# Patient Record
Sex: Male | Born: 1975 | Race: White | Hispanic: No | Marital: Single | Smoking: Current every day smoker
Health system: Southern US, Community
[De-identification: ages and names within clinical notes are randomized; demographics above are authoritative.]

## PROBLEM LIST (undated history)

## (undated) DIAGNOSIS — K909 Intestinal malabsorption, unspecified: Secondary | ICD-10-CM

## (undated) DIAGNOSIS — M419 Scoliosis, unspecified: Secondary | ICD-10-CM

## (undated) DIAGNOSIS — H9190 Unspecified hearing loss, unspecified ear: Secondary | ICD-10-CM

## (undated) DIAGNOSIS — I471 Supraventricular tachycardia, unspecified: Secondary | ICD-10-CM

## (undated) DIAGNOSIS — B069 Rubella without complication: Secondary | ICD-10-CM

## (undated) HISTORY — PX: GASTROSTOMY W/ FEEDING TUBE: SUR642

## (undated) HISTORY — PX: TRACHEOSTOMY: SUR1362

## (undated) HISTORY — PX: BACK SURGERY: SHX140

---

## 2012-08-09 ENCOUNTER — Encounter (HOSPITAL_COMMUNITY): Payer: Self-pay | Admitting: *Deleted

## 2012-08-09 ENCOUNTER — Inpatient Hospital Stay (HOSPITAL_COMMUNITY)
Admission: EM | Admit: 2012-08-09 | Discharge: 2012-08-11 | DRG: 193 | Disposition: A | Discharge status: Home / Self Care | Payer: No Typology Code available for payment source | Attending: Internal Medicine | Admitting: Internal Medicine

## 2012-08-09 ENCOUNTER — Emergency Department (HOSPITAL_COMMUNITY): Payer: No Typology Code available for payment source

## 2012-08-09 DIAGNOSIS — E46 Unspecified protein-calorie malnutrition: Secondary | ICD-10-CM

## 2012-08-09 DIAGNOSIS — D72829 Elevated white blood cell count, unspecified: Secondary | ICD-10-CM | POA: Diagnosis present

## 2012-08-09 DIAGNOSIS — H9190 Unspecified hearing loss, unspecified ear: Secondary | ICD-10-CM | POA: Diagnosis present

## 2012-08-09 DIAGNOSIS — E43 Unspecified severe protein-calorie malnutrition: Secondary | ICD-10-CM

## 2012-08-09 DIAGNOSIS — F17200 Nicotine dependence, unspecified, uncomplicated: Secondary | ICD-10-CM

## 2012-08-09 DIAGNOSIS — M4120 Other idiopathic scoliosis, site unspecified: Secondary | ICD-10-CM | POA: Diagnosis present

## 2012-08-09 DIAGNOSIS — M419 Scoliosis, unspecified: Secondary | ICD-10-CM

## 2012-08-09 DIAGNOSIS — I471 Supraventricular tachycardia, unspecified: Secondary | ICD-10-CM

## 2012-08-09 DIAGNOSIS — I498 Other specified cardiac arrhythmias: Secondary | ICD-10-CM | POA: Diagnosis present

## 2012-08-09 DIAGNOSIS — J9600 Acute respiratory failure, unspecified whether with hypoxia or hypercapnia: Secondary | ICD-10-CM

## 2012-08-09 DIAGNOSIS — Z681 Body mass index (BMI) 19 or less, adult: Secondary | ICD-10-CM

## 2012-08-09 DIAGNOSIS — J189 Pneumonia, unspecified organism: Principal | ICD-10-CM

## 2012-08-09 DIAGNOSIS — K909 Intestinal malabsorption, unspecified: Secondary | ICD-10-CM | POA: Diagnosis present

## 2012-08-09 DIAGNOSIS — F1210 Cannabis abuse, uncomplicated: Secondary | ICD-10-CM | POA: Diagnosis present

## 2012-08-09 HISTORY — DX: Supraventricular tachycardia: I47.1

## 2012-08-09 HISTORY — DX: Scoliosis, unspecified: M41.9

## 2012-08-09 HISTORY — DX: Supraventricular tachycardia, unspecified: I47.10

## 2012-08-09 HISTORY — DX: Intestinal malabsorption, unspecified: K90.9

## 2012-08-09 HISTORY — DX: Rubella without complication: B06.9

## 2012-08-09 LAB — COMPREHENSIVE METABOLIC PANEL
AST: 22 U/L (ref 0–37)
Albumin: 4.4 g/dL (ref 3.5–5.2)
Alkaline Phosphatase: 116 U/L (ref 39–117)
BUN: 5 mg/dL — ABNORMAL LOW (ref 6–23)
Creatinine, Ser: 0.55 mg/dL (ref 0.50–1.35)
Potassium: 3.8 mEq/L (ref 3.5–5.1)
Total Protein: 8.6 g/dL — ABNORMAL HIGH (ref 6.0–8.3)

## 2012-08-09 LAB — HIV ANTIBODY (ROUTINE TESTING W REFLEX): HIV: NONREACTIVE

## 2012-08-09 LAB — CBC WITH DIFFERENTIAL/PLATELET
Basophils Absolute: 0 10*3/uL (ref 0.0–0.1)
Basophils Relative: 0 % (ref 0–1)
Eosinophils Absolute: 0.1 10*3/uL (ref 0.0–0.7)
Hemoglobin: 15.9 g/dL (ref 13.0–17.0)
MCH: 33.3 pg (ref 26.0–34.0)
MCHC: 34.8 g/dL (ref 30.0–36.0)
Monocytes Relative: 7 % (ref 3–12)
Neutrophils Relative %: 86 % — ABNORMAL HIGH (ref 43–77)
RDW: 13 % (ref 11.5–15.5)

## 2012-08-09 LAB — RAPID URINE DRUG SCREEN, HOSP PERFORMED
Amphetamines: NOT DETECTED
Benzodiazepines: NOT DETECTED
Opiates: NOT DETECTED

## 2012-08-09 LAB — URINALYSIS, ROUTINE W REFLEX MICROSCOPIC
Glucose, UA: NEGATIVE mg/dL
Hgb urine dipstick: NEGATIVE
Ketones, ur: NEGATIVE mg/dL
Protein, ur: NEGATIVE mg/dL

## 2012-08-09 LAB — ETHANOL: Alcohol, Ethyl (B): <11 mg/dL (ref 0–11)

## 2012-08-09 LAB — STREP PNEUMONIAE URINARY ANTIGEN: Strep Pneumo Urinary Antigen: NEGATIVE

## 2012-08-09 MED ORDER — SODIUM CHLORIDE 0.9 % IJ SOLN: 3.0000 mL | INTRAMUSCULAR | Status: DC | PRN

## 2012-08-09 MED ORDER — SODIUM CHLORIDE 0.9 % IV SOLN
INTRAVENOUS | Status: DC
  Administered 2012-08-09: 11:00:00 via INTRAVENOUS

## 2012-08-09 MED ORDER — SODIUM CHLORIDE 0.9 % IV SOLN: 250.0000 mL | INTRAVENOUS | Status: DC | PRN

## 2012-08-09 MED ORDER — DEXTROSE 5 % IV SOLN
500.0000 mg | INTRAVENOUS | Status: DC
  Administered 2012-08-09: 500 mg via INTRAVENOUS
  Filled 2012-08-09: qty 500

## 2012-08-09 MED ORDER — NICOTINE 14 MG/24HR TD PT24
14.0000 mg | MEDICATED_PATCH | Freq: Every day | TRANSDERMAL | Status: DC
  Administered 2012-08-09 – 2012-08-10 (×2): 14 mg via TRANSDERMAL
  Filled 2012-08-09 (×3): qty 1

## 2012-08-09 MED ORDER — SODIUM CHLORIDE 0.9 % IJ SOLN: 3.0000 mL | Freq: Two times a day (BID) | INTRAMUSCULAR | Status: DC

## 2012-08-09 MED ORDER — DEXTROSE 5 % IV SOLN
1.0000 g | INTRAVENOUS | Status: DC
  Administered 2012-08-10: 1 g via INTRAVENOUS
  Filled 2012-08-09 (×2): qty 10

## 2012-08-09 MED ORDER — DEXTROSE 5 % IV SOLN
1.0000 g | INTRAVENOUS | Status: DC
  Administered 2012-08-09: 1 g via INTRAVENOUS
  Filled 2012-08-09: qty 10

## 2012-08-09 MED ORDER — AZITHROMYCIN 500 MG PO TABS
500.0000 mg | ORAL_TABLET | ORAL | Status: DC
  Administered 2012-08-10: 500 mg via ORAL
  Filled 2012-08-09 (×2): qty 1

## 2012-08-09 MED ORDER — IOHEXOL 350 MG/ML SOLN
100.0000 mL | Freq: Once | INTRAVENOUS | Status: AC | PRN
  Administered 2012-08-09: 100 mL via INTRAVENOUS

## 2012-08-09 MED ORDER — SODIUM CHLORIDE 0.9 % IV BOLUS (SEPSIS)
1000.0000 mL | Freq: Once | INTRAVENOUS | Status: AC
  Administered 2012-08-09: 1000 mL via INTRAVENOUS

## 2012-08-09 MED ORDER — ENSURE PUDDING PO PUDG
1.0000 | Freq: Three times a day (TID) | ORAL | Status: DC
  Filled 2012-08-09 (×8): qty 1

## 2012-08-09 MED ORDER — ZOLPIDEM TARTRATE 5 MG PO TABS
5.0000 mg | ORAL_TABLET | Freq: Once | ORAL | Status: AC
  Administered 2012-08-09: 5 mg via ORAL
  Filled 2012-08-09: qty 1

## 2012-08-09 NOTE — Progress Notes (Signed)
Patient utilizing his ipad to communicate with staff. His girlfriend is at the bedside however she is deaf and mute as well.

## 2012-08-09 NOTE — ED Notes (Signed)
[facility]e called for report, RN cannot take report at this time

## 2012-08-09 NOTE — Progress Notes (Signed)
RN from [facility]east returned call to ED nurse. Not able to speak to nurse.

## 2012-08-09 NOTE — ED Provider Notes (Signed)
History     CSN: [id number]  Arrival date & time 08/09/12  4098   First MD Initiated Contact with Patient 08/09/12 0900      Chief Complaint  Patient presents with  . Shortness of Breath  . Tachycardia    (Consider location/radiation/quality/duration/timing/severity/associated sxs/prior treatment) Patient is a 37 y.o. male presenting with shortness of breath. The history is provided by a parent and the patient.  Shortness of Breath  patient here after complaining of heart racing and shortness of breath. Symptoms have been there for 2-3 days. Has had a cough has been nonproductive. Denies any black or bloody stools. No chest pain chest pressure. Does have a history of SVT. No vomiting or diarrhea. Symptoms have been gradually getting worse and no treatment used prior to arrival. Patient does have a history of illicit drug use the past and admits to heavy alcohol use daily  Past Medical History  Diagnosis Date  . SVT (supraventricular tachycardia)   . Malabsorption   . [place] measles     No past surgical history on file.  No family history on file.  History  Substance Use Topics  . Smoking status: Not on file  . Smokeless tobacco: Not on file  . Alcohol Use: Not on file      Review of Systems  Respiratory: Positive for shortness of breath.   All other systems reviewed and are negative.    Allergies  Atropine  Home Medications  No current outpatient prescriptions on file.  BP 139/88  Pulse 99  Temp(Src) 98.1 F (36.7 C) (Oral)  Resp 20  SpO2 88%  Physical Exam  Nursing note and vitals reviewed. Constitutional: He is oriented to person, place, and time. He appears cachectic.  Non-toxic appearance. He has a sickly appearance. He appears ill. No distress.  HENT:  Head: Normocephalic and atraumatic.  Eyes: Conjunctivae, EOM and lids are normal. Pupils are equal, round, and reactive to light.  Neck: Normal range of motion. Neck supple. No tracheal deviation  present. No mass present.  Cardiovascular: Normal rate, regular rhythm and normal heart sounds.  Exam reveals no gallop.   No murmur heard. Pulmonary/Chest: Effort normal. No stridor. No respiratory distress. He has decreased breath sounds. He has wheezes. He has no rhonchi. He has no rales.  Abdominal: Soft. Normal appearance and bowel sounds are normal. He exhibits no distension. There is no tenderness. There is no rebound and no CVA tenderness.  Musculoskeletal: Normal range of motion. He exhibits no edema and no tenderness.  Neurological: He is alert and oriented to person, place, and time. He has normal strength. No cranial nerve deficit or sensory deficit. GCS eye subscore is 4. GCS verbal subscore is 5. GCS motor subscore is 6.  Skin: Skin is warm and dry. No abrasion and no rash noted.  Psychiatric: He has a normal mood and affect. His speech is normal and behavior is normal.    ED Course  Procedures (including critical care time)  Labs Reviewed - No data to display No results found.   No diagnosis found.    MDM   Date: 08/09/2012  Rate: 110  Rhythm: sinus tachycardia  QRS Axis: normal  Intervals: normal  ST/T Wave abnormalities: nonspecific ST changes  Conduction Disutrbances:none  Narrative Interpretation:   Old EKG Reviewed: none available  1:05 PM Pt to be admitted for CAP        Toy Baker, MD 08/09/12 1305

## 2012-08-09 NOTE — ED Notes (Signed)
Per pt mother: Onset of SOB and "heart racing" last night, pt called ems around 230 am, refused to come to ED at that time. Pt texted mother this morning with same symptoms, brought into ED. Pt states he was not doing anything physical when the SOB - recently moved into new apartment (4-6 weeks ago), states air is "different" there. No hx of SOB

## 2012-08-09 NOTE — H&P (Signed)
[facility] History and Physical  KWABENA STRUTZ ZOX:[id number] DOB: 1975/07/13 DOA: 08/09/2012  Referring physician: Freida Busman PCP: No primary provider on file.  Specialists: none  Chief Complaint: SOB  HPI: Richard York is a 37 y.o. male  With history of malabsorption, SVT, and scoliosis.  Presented to the Ed complaining of SOB which started yesterday evening.  Patient is deaf and history is obtained through interpretation from mother.  He reports that the problem has been persistent since onset and nothing he is aware of has made it better or worse.  Currently feels improvement while on supplemental oxygen.  While in the Ed patient has elevated WBC of 14.9, normal lactic acid level, CT antiogram of chest showing no evidence of PE but mild patchy opacities in the Right upper and Right middle lobe suspicious for pneumonia.    We have been consulted for admission evaluation and recommendations for hypoxia and pna  Review of Systems: 10 point review of system reviewed and negative unless otherwise mentioned above.  Past Medical History  Diagnosis Date  . SVT (supraventricular tachycardia)   . Malabsorption   . [place] measles   . Scoliosis    Past Surgical History  Procedure Laterality Date  . Back surgery      titanium rods for scoliosis  . Tracheostomy    . Gastrostomy w/ feeding tube     Social History:  reports that he has been smoking Cigarettes.  He has been smoking about 1.00 pack per day. He does not have any smokeless tobacco history on file. He reports that  drinks alcohol. He reports that he uses illicit drugs (Marijuana). Lives at his home  Can patient participate in ADLs? yes  Allergies  Allergen Reactions  . Atropine Anaphylaxis and Swelling  . Albuterol     No family history on file. None other reported  Prior to Admission medications   Not on File   Physical Exam: Filed Vitals:   08/09/12 0931 08/09/12 1107 08/09/12 1135 08/09/12 1310  BP:   151/82 148/77 133/78  Pulse:  99 114 91  Temp:  97.8 F (36.6 C) 98.1 F (36.7 C)   TempSrc:  Oral Oral   Resp:  26 24 23   Height:  5\' 8"  (1.727 m)    Weight:  38.556 kg (85 lb)    SpO2: 86% 93% 94% 97%     General:  Pt in NAd, Alert and Awake, thin appearing male  Eyes: EOMI, non icteric  ENT: normal exterior appearance  Neck: supple, no goiter  Cardiovascular: RRR, no MRG  Respiratory: no wheezes, breathing comfortably on supplemental oxygen via [place], mild rhales  Abdomen: soft, NT, ND   Skin: warm and dry  Musculoskeletal: no cyanosis or clubbing  Psychiatric: mood and affect appropriate  Neurologic: answers questions appropriately, follows directions, moves all extremities.  Labs on Admission:  Basic Metabolic Panel:  Recent Labs Lab 08/09/12 0945  NA 132*  K 3.8  CL 93*  CO2 30  GLUCOSE 110*  BUN 5*  CREATININE 0.55  CALCIUM 9.9   Liver Function Tests:  Recent Labs Lab 08/09/12 0945  AST 22  ALT 15  ALKPHOS 116  BILITOT 0.5  PROT 8.6*  ALBUMIN 4.4   No results found for this basename: LIPASE, AMYLASE,  in the last 168 hours No results found for this basename: AMMONIA,  in the last 168 hours CBC:  Recent Labs Lab 08/09/12 0945  WBC 14.9*  NEUTROABS 12.8*  HGB 15.9  HCT 45.7  MCV 95.8  PLT 265   Cardiac Enzymes: No results found for this basename: CKTOTAL, CKMB, CKMBINDEX, TROPONINI,  in the last 168 hours  BNP (last 3 results) No results found for this basename: PROBNP,  in the last 8760 hours CBG: No results found for this basename: GLUCAP,  in the last 168 hours  Radiological Exams on Admission: Dg Chest 2 View  08/09/2012   *RADIOLOGY REPORT*  Clinical Data: Cough and chest pain.  Suspect pneumonia.  CHEST - 2 VIEW  Comparison: None.  Findings: Trachea is midline.  Heart size normal.  Lungs are hyperinflated but clear.  No pleural fluid.  Harrington rods span dextroconvex scoliosis in the thoracolumbar spine.  Osteopenia.   IMPRESSION: Hyperinflation without acute findings.   Original Report Authenticated By: Leanna Battles, M.D.   Ct Angio Chest Pe W/cm &/or Wo Cm  08/09/2012   *RADIOLOGY REPORT*  Clinical Data: SOB, tachycardia  CT ANGIOGRAPHY CHEST  Technique:  Multidetector CT imaging of the chest using the standard protocol during bolus administration of intravenous contrast. Multiplanar reconstructed images including MIPs were obtained and reviewed to evaluate the vascular anatomy.  Contrast: OMNIPAQUE IOHEXOL 350 MG/ML SOLN  Comparison: Chest radiographs dated 08/09/2012  Findings: No evidence of pulmonary embolism.  Mild patchy opacities in the anteromedial right upper lobe (series 8/images 26 and 37) and medial right middle lobe (series 8/image 61), suspicious for pneumonia.  No pleural effusion or pneumothorax.  The visualized thyroid is unremarkable.  The heart is normal in size.  No pericardial effusion.  No suspicious mediastinal or axillary lymphadenopathy.  Visualized upper abdomen is grossly unremarkable.  Thoracolumbar scoliosis with spinal rods.  IMPRESSION: No evidence of pulmonary embolism.  Mild patchy opacities in the right upper and middle lobes, suspicious for pneumonia.   Original Report Authenticated By: Charline Bills, M.D.    EKG: Independently reviewed. Sinus tachycardia no st elevation or depressions  Assessment/Plan Active Problems:   1. Acute respiratory failure - Will administer supplemental oxygen - avoid albuterol given history of SVT - monitor on telemetry given history of SVT - CT angiogram of chest negative for PE  2. CAP - wean off of supplemental oxygen - Azithromycin and Rocephin - urine legionella/strep antigen - sputum culture  3. History of SVT - currently in sinus rhythm - monitor on telemetry - albuterol  4. Malnutrition - unable to fully characterized, mother reports that patient has tested false positive for CF in the past.  Has had difficulty  maintaining weight. - Place order for dietitian evaluation - add ensure to regimen  5. Nicotine dependence - place order for nicotine patch while in house and recommend cessation.  6. DVT prophylaxis heparin  Code Status: full Family Communication: discussed with mother at bedside. Disposition Plan: Pending improvement in condition.   Time spent: > 65 minutes  Penny Pia [facility] Pager [phone]  If 7PM-7AM, please contact night-coverage [email or web address] Password [email or web address] 08/09/2012, 2:02 PM

## 2012-08-10 DIAGNOSIS — E43 Unspecified severe protein-calorie malnutrition: Secondary | ICD-10-CM

## 2012-08-10 LAB — LEGIONELLA ANTIGEN, URINE

## 2012-08-10 LAB — BASIC METABOLIC PANEL
Calcium: 9.4 mg/dL (ref 8.4–10.5)
Chloride: 98 mEq/L (ref 96–112)
Creatinine, Ser: 0.48 mg/dL — ABNORMAL LOW (ref 0.50–1.35)
GFR calc Af Amer: >90 mL/min (ref >90)

## 2012-08-10 LAB — CBC
Platelets: 270 10*3/uL (ref 150–400)
RDW: 13 % (ref 11.5–15.5)
WBC: 13.9 10*3/uL — ABNORMAL HIGH (ref 4.0–10.5)

## 2012-08-10 MED ORDER — HEPARIN SODIUM (PORCINE) 5000 UNIT/ML IJ SOLN
5000.0000 [IU] | Freq: Three times a day (TID) | INTRAMUSCULAR | Status: DC
  Administered 2012-08-10 (×2): 5000 [IU] via SUBCUTANEOUS
  Filled 2012-08-10 (×6): qty 1

## 2012-08-10 MED ORDER — GUAIFENESIN-DM 100-10 MG/5ML PO SYRP: 5.0000 mL | ORAL_SOLUTION | ORAL | Status: DC | PRN

## 2012-08-10 NOTE — Progress Notes (Signed)
[facility] PROGRESS NOTE  Assessment/Plan:  *Acute respiratory failure - due to CAP. - cont O2 wean off.  Community acquired pneumonia - rocephin and azith 5.31.2014 - afebrile, mild leukocytosis. - dextrometorphan.  Nicotine dependence -nicotine patch while in house   Severe protein malnutrition  Malnutrition: - ensure tid    Code Status: full Family Communication: none  Disposition Plan: home in am   Consultants:  none  Procedures:  none  Antibiotics:  Rocephin and azithro  HPI/Subjective: Feels better just the cough bothering him.  Objective: Filed Vitals:   08/09/12 1525 08/09/12 2101 08/09/12 2200 08/10/12 0613  BP: 138/86 127/80  128/82  Pulse: 86 87  96  Temp: 98.3 F (36.8 C) 98 F (36.7 C)  97.8 F (36.6 C)  TempSrc: Oral Oral  Oral  Resp: 22 20  18   Height: 5\' 8"  (1.727 m)     Weight: 37.875 kg (83 lb 8 oz)     SpO2: 92% 91% 95% 91%    Intake/Output Summary (Last 24 hours) at 08/10/12 0905 Last data filed at 08/10/12 0500  Gross per 24 hour  Intake    360 ml  Output   1700 ml  Net  -1340 ml   Filed Weights   08/09/12 1107 08/09/12 1525  Weight: 38.556 kg (85 lb) 37.875 kg (83 lb 8 oz)    Exam:  General: Alert, awake, oriented x3, in no acute distress.  HEENT: No bruits, no goiter.  Heart: Regular rate and rhythm, without murmurs, rubs, gallops.  Lungs: Good air movement, clear to auscultation Abdomen: Soft, nontender, nondistended, positive bowel sounds.  Neuro: Grossly intact, nonfocal.   Data Reviewed: Basic Metabolic Panel:  Recent Labs Lab 08/09/12 0945 08/10/12 0545  NA 132* 136  K 3.8 3.9  CL 93* 98  CO2 30 29  GLUCOSE 110* 92  BUN 5* 3*  CREATININE 0.55 0.48*  CALCIUM 9.9 9.4   Liver Function Tests:  Recent Labs Lab 08/09/12 0945  AST 22  ALT 15  ALKPHOS 116  BILITOT 0.5  PROT 8.6*  ALBUMIN 4.4   No results found for this basename: LIPASE, AMYLASE,  in the last 168 hours No results  found for this basename: AMMONIA,  in the last 168 hours CBC:  Recent Labs Lab 08/09/12 0945 08/10/12 0545  WBC 14.9* 13.9*  NEUTROABS 12.8*  --   HGB 15.9 14.9  HCT 45.7 43.9  MCV 95.8 96.1  PLT 265 270   Cardiac Enzymes: No results found for this basename: CKTOTAL, CKMB, CKMBINDEX, TROPONINI,  in the last 168 hours BNP (last 3 results) No results found for this basename: PROBNP,  in the last 8760 hours CBG: No results found for this basename: GLUCAP,  in the last 168 hours  No results found for this or any previous visit (from the past 240 hour(s)).   Studies: Dg Chest 2 View  08/09/2012   *RADIOLOGY REPORT*  Clinical Data: Cough and chest pain.  Suspect pneumonia.  CHEST - 2 VIEW  Comparison: None.  Findings: Trachea is midline.  Heart size normal.  Lungs are hyperinflated but clear.  No pleural fluid.  Harrington rods span dextroconvex scoliosis in the thoracolumbar spine.  Osteopenia.  IMPRESSION: Hyperinflation without acute findings.   Original Report Authenticated By: Leanna Battles, M.D.   Ct Angio Chest Pe W/cm &/or Wo Cm  08/09/2012   *RADIOLOGY REPORT*  Clinical Data: SOB, tachycardia  CT ANGIOGRAPHY CHEST  Technique:  Multidetector CT imaging of the chest  using the standard protocol during bolus administration of intravenous contrast. Multiplanar reconstructed images including MIPs were obtained and reviewed to evaluate the vascular anatomy.  Contrast: OMNIPAQUE IOHEXOL 350 MG/ML SOLN  Comparison: Chest radiographs dated 08/09/2012  Findings: No evidence of pulmonary embolism.  Mild patchy opacities in the anteromedial right upper lobe (series 8/images 26 and 37) and medial right middle lobe (series 8/image 61), suspicious for pneumonia.  No pleural effusion or pneumothorax.  The visualized thyroid is unremarkable.  The heart is normal in size.  No pericardial effusion.  No suspicious mediastinal or axillary lymphadenopathy.  Visualized upper abdomen is grossly  unremarkable.  Thoracolumbar scoliosis with spinal rods.  IMPRESSION: No evidence of pulmonary embolism.  Mild patchy opacities in the right upper and middle lobes, suspicious for pneumonia.   Original Report Authenticated By: Charline Bills, M.D.    Scheduled Meds: . azithromycin  500 mg Oral Q24H  . cefTRIAXone (ROCEPHIN)  IV  1 g Intravenous Q24H  . feeding supplement  1 Container Oral TID BM  . nicotine  14 mg Transdermal Daily  . sodium chloride  3 mL Intravenous Q12H   Continuous Infusions:    Marinda Elk  [facility] Pager [phone]. If 8PM-8AM, please contact night-coverage at [email or web address], password [email or web address] 08/10/2012, 9:05 AM  LOS: 1 day

## 2012-08-11 MED ORDER — LEVOFLOXACIN 750 MG PO TABS: 750.0000 mg | ORAL_TABLET | Freq: Every day | ORAL | Status: DC

## 2012-08-11 MED ORDER — LEVOFLOXACIN 750 MG PO TABS
750.0000 mg | ORAL_TABLET | Freq: Every day | ORAL | Status: DC
  Filled 2012-08-11: qty 1

## 2012-08-11 NOTE — Discharge Summary (Signed)
Physician Discharge Summary  MARKISE HAYMER MWN:[id number] DOB: [birth year] DOA: 08/09/2012  PCP: No primary provider on file.  Admit date: 08/09/2012 Discharge date: 08/11/2012  Time spent: 35 minutes  Recommendations for Outpatient Follow-up:  1. Follow up with PCP  Discharge Diagnoses:  Principal Problem:   Acute respiratory failure Active Problems:   Community acquired pneumonia   history of SVT   Scoliosis   Nicotine dependence   Severe protein-calorie malnutrition   Discharge Condition: stable  Diet recommendation: regular  Filed Weights   08/09/12 1107 08/09/12 1525  Weight: 38.556 kg (85 lb) 37.875 kg (83 lb 8 oz)    History of present illness:  37 y.o. male  With history of malabsorption, SVT, and scoliosis. Presented to the Ed complaining of SOB which started yesterday evening. Patient is deaf and history is obtained through interpretation from mother. He reports that the problem has been persistent since onset and nothing he is aware of has made it better or worse. Currently feels improvement while on supplemental oxygen.  While in the Ed patient has elevated WBC of 14.9, normal lactic acid level, CT antiogram of chest showing no evidence of PE but mild patchy opacities in the Right upper and Right middle lobe suspicious for pneumonia.  We have been consulted for admission evaluation and recommendations for hypoxia and pna   Hospital Course:  *Acute respiratory failure  - due to CAP.  - cont O2 wean off.   Community acquired pneumonia  - Started empirically on  rocephin and azith 5.31.2014, change to levaquin orally will cont for 14 day course. - afebrile, mild leukocytosis.  - dextrometorphan.   Nicotine dependence  -nicotine patch while in house   Severe protein malnutrition Malnutrition:  - ensure tid   Procedures:  CXR  Consultations:  none  Discharge Exam: Filed Vitals:   08/10/12 0613 08/10/12 1429 08/10/12 2209 08/11/12 0549  BP:  128/82 107/71 123/75 101/64  Pulse: 96 96 68 72  Temp: 97.8 F (36.6 C) 97.9 F (36.6 C) 98.2 F (36.8 C) 97.5 F (36.4 C)  TempSrc: Oral Oral Oral Oral  Resp: 18 18 18 18   Height:      Weight:      SpO2: 91% 95% 96% 95%    General: A&O x3 Cardiovascular: RRR Respiratory: good air mvoement CTA B/L  Discharge Instructions  Discharge Orders   Future Orders Complete By Expires     Diet - low sodium heart healthy  As directed     Increase activity slowly  As directed         Medication List    TAKE these medications       levofloxacin 750 MG tablet  Commonly known as:  LEVAQUIN  Take 1 tablet (750 mg total) by mouth daily.       Allergies  Allergen Reactions  . Atropine Anaphylaxis and Swelling  . Albuterol       The results of significant diagnostics from this hospitalization (including imaging, microbiology, ancillary and laboratory) are listed below for reference.    Significant Diagnostic Studies: Dg Chest 2 View  08/09/2012   *RADIOLOGY REPORT*  Clinical Data: Cough and chest pain.  Suspect pneumonia.  CHEST - 2 VIEW  Comparison: None.  Findings: Trachea is midline.  Heart size normal.  Lungs are hyperinflated but clear.  No pleural fluid.  Harrington rods span dextroconvex scoliosis in the thoracolumbar spine.  Osteopenia.  IMPRESSION: Hyperinflation without acute findings.   Original Report Authenticated By: Juliette Alcide  Fredirick Lathe, M.D.   Ct Angio Chest Pe W/cm &/or Wo Cm  08/09/2012   *RADIOLOGY REPORT*  Clinical Data: SOB, tachycardia  CT ANGIOGRAPHY CHEST  Technique:  Multidetector CT imaging of the chest using the standard protocol during bolus administration of intravenous contrast. Multiplanar reconstructed images including MIPs were obtained and reviewed to evaluate the vascular anatomy.  Contrast: OMNIPAQUE IOHEXOL 350 MG/ML SOLN  Comparison: Chest radiographs dated 08/09/2012  Findings: No evidence of pulmonary embolism.  Mild patchy opacities in the  anteromedial right upper lobe (series 8/images 26 and 37) and medial right middle lobe (series 8/image 61), suspicious for pneumonia.  No pleural effusion or pneumothorax.  The visualized thyroid is unremarkable.  The heart is normal in size.  No pericardial effusion.  No suspicious mediastinal or axillary lymphadenopathy.  Visualized upper abdomen is grossly unremarkable.  Thoracolumbar scoliosis with spinal rods.  IMPRESSION: No evidence of pulmonary embolism.  Mild patchy opacities in the right upper and middle lobes, suspicious for pneumonia.   Original Report Authenticated By: Charline Bills, M.D.    Microbiology: Recent Results (from the past 240 hour(s))  CULTURE, BLOOD (ROUTINE X 2)     Status: None   Collection Time    08/09/12  4:11 PM      Result Value Range Status   Specimen Description BLOOD RIGHT ARM  2.5 ML IN [facility] BOTTLE   Final   Special Requests Immunocompromised   Final   Culture  Setup Time 08/09/2012 19:45   Final   Culture     Final   Value:        BLOOD CULTURE RECEIVED NO GROWTH TO DATE CULTURE WILL BE HELD FOR 5 DAYS BEFORE ISSUING A FINAL NEGATIVE REPORT   Report Status PENDING   Incomplete  CULTURE, BLOOD (ROUTINE X 2)     Status: None   Collection Time    08/09/12  4:17 PM      Result Value Range Status   Specimen Description BLOOD RIGHT ARM  1 ML IN AEROBIC ONLY   Final   Special Requests Immunocompromised   Final   Culture  Setup Time 08/09/2012 19:45   Final   Culture     Final   Value:        BLOOD CULTURE RECEIVED NO GROWTH TO DATE CULTURE WILL BE HELD FOR 5 DAYS BEFORE ISSUING A FINAL NEGATIVE REPORT   Report Status PENDING   Incomplete     Labs: Basic Metabolic Panel:  Recent Labs Lab 08/09/12 0945 08/10/12 0545  NA 132* 136  K 3.8 3.9  CL 93* 98  CO2 30 29  GLUCOSE 110* 92  BUN 5* 3*  CREATININE 0.55 0.48*  CALCIUM 9.9 9.4   Liver Function Tests:  Recent Labs Lab 08/09/12 0945  AST 22  ALT 15  ALKPHOS 116  BILITOT 0.5  PROT  8.6*  ALBUMIN 4.4   No results found for this basename: LIPASE, AMYLASE,  in the last 168 hours No results found for this basename: AMMONIA,  in the last 168 hours CBC:  Recent Labs Lab 08/09/12 0945 08/10/12 0545  WBC 14.9* 13.9*  NEUTROABS 12.8*  --   HGB 15.9 14.9  HCT 45.7 43.9  MCV 95.8 96.1  PLT 265 270   Cardiac Enzymes: No results found for this basename: CKTOTAL, CKMB, CKMBINDEX, TROPONINI,  in the last 168 hours BNP: BNP (last 3 results) No results found for this basename: PROBNP,  in the last 8760 hours CBG: No  results found for this basename: GLUCAP,  in the last 168 hours     Signed:  Marinda Elk  [facility] 08/11/2012, 8:30 AM

## 2012-08-11 NOTE — Progress Notes (Signed)
Patient's mother received discharge instructions with understanding.  Given prescription for levaquin with instructions on how to take this medication.  Declined to initiate My Chart but took the information and instructions on how to set this up on their home computer.

## 2012-08-11 NOTE — Progress Notes (Signed)
Patient telemetry discontinued, notified central telemetry of discharge

## 2012-08-15 LAB — CULTURE, BLOOD (ROUTINE X 2): Culture: NO GROWTH

## 2014-07-30 ENCOUNTER — Encounter (HOSPITAL_COMMUNITY): Payer: Self-pay | Admitting: Emergency Medicine

## 2014-07-30 ENCOUNTER — Emergency Department (HOSPITAL_COMMUNITY): Payer: Medicare Other

## 2014-07-30 ENCOUNTER — Emergency Department (HOSPITAL_COMMUNITY)
Admission: EM | Admit: 2014-07-30 | Discharge: 2014-07-30 | Disposition: A | Discharge status: Home / Self Care | Payer: Medicare Other | Attending: Emergency Medicine | Admitting: Emergency Medicine

## 2014-07-30 DIAGNOSIS — M545 Low back pain, unspecified: Secondary | ICD-10-CM

## 2014-07-30 DIAGNOSIS — Z720 Tobacco use: Secondary | ICD-10-CM | POA: Diagnosis not present

## 2014-07-30 DIAGNOSIS — Z8679 Personal history of other diseases of the circulatory system: Secondary | ICD-10-CM | POA: Diagnosis not present

## 2014-07-30 DIAGNOSIS — J40 Bronchitis, not specified as acute or chronic: Secondary | ICD-10-CM | POA: Diagnosis not present

## 2014-07-30 DIAGNOSIS — Z9889 Other specified postprocedural states: Secondary | ICD-10-CM | POA: Insufficient documentation

## 2014-07-30 DIAGNOSIS — Z8619 Personal history of other infectious and parasitic diseases: Secondary | ICD-10-CM | POA: Diagnosis not present

## 2014-07-30 DIAGNOSIS — R05 Cough: Secondary | ICD-10-CM | POA: Diagnosis present

## 2014-07-30 DIAGNOSIS — Z8719 Personal history of other diseases of the digestive system: Secondary | ICD-10-CM | POA: Insufficient documentation

## 2014-07-30 MED ORDER — CYCLOBENZAPRINE HCL 10 MG PO TABS: 5.0000 mg | ORAL_TABLET | Freq: Three times a day (TID) | ORAL | Status: DC | PRN

## 2014-07-30 MED ORDER — BENZONATATE 100 MG PO CAPS: 100.0000 mg | ORAL_CAPSULE | Freq: Three times a day (TID) | ORAL | Status: DC

## 2014-07-30 MED ORDER — KETOROLAC TROMETHAMINE 60 MG/2ML IM SOLN
60.0000 mg | Freq: Once | INTRAMUSCULAR | Status: AC
  Administered 2014-07-30: 60 mg via INTRAMUSCULAR
  Filled 2014-07-30: qty 2

## 2014-07-30 MED ORDER — CYCLOBENZAPRINE HCL 10 MG PO TABS
5.0000 mg | ORAL_TABLET | Freq: Once | ORAL | Status: DC
  Filled 2014-07-30: qty 1

## 2014-07-30 MED ORDER — IBUPROFEN 600 MG PO TABS: 600.0000 mg | ORAL_TABLET | Freq: Three times a day (TID) | ORAL | Status: DC | PRN

## 2014-07-30 NOTE — ED Notes (Signed)
Patient states he has been having a cough for about 1 week, feels like the pneumonia he had about 1 year ago. Patient states the cough is making his back hurt on the right side. Patient c/o feeling hot/cold, denies fever. Patient states cough has been productive but can not describe it. Patient is deaf, triage completed using pen and paper.

## 2014-07-30 NOTE — ED Notes (Signed)
Sign language interpreter was requested for patient.

## 2014-07-30 NOTE — Discharge Instructions (Signed)

## 2014-07-31 NOTE — ED Provider Notes (Signed)
CSN: [id number]     Arrival date & time 07/30/14  0630 History   First MD Initiated Contact with Patient 07/30/14 [phone]     Chief Complaint  Patient presents with  . Cough      HPI  Patient presents to the emergency department for productive cough over the past week.  He denies fevers and chills.  He reports severe pain in his right low back from coughing.  He reports the pain radiates down to his right lateral thigh.  No chest pain or shortness of breath.  No abdominal pain.  No upper back pain.  Symptoms are moderate in severity.  He is not tried any medicine for his pain.  He tried Robitussin last night without improvement in his cough.    Past Medical History  Diagnosis Date  . SVT (supraventricular tachycardia)   . Malabsorption   . [place] measles   . Scoliosis    Past Surgical History  Procedure Laterality Date  . Back surgery      titanium rods for scoliosis  . Tracheostomy    . Gastrostomy w/ feeding tube     History reviewed. No pertinent family history. History  Substance Use Topics  . Smoking status: Current Every Day Smoker -- 0.30 packs/day    Types: Cigarettes  . Smokeless tobacco: Not on file  . Alcohol Use: Yes     Comment: 3-20 oz smirnoff every other day, none in one week 07/30/2014    Review of Systems  All other systems reviewed and are negative.     Allergies  Atropine and Albuterol  Home Medications   Prior to Admission medications   Medication Sig Start Date End Date Taking? Authorizing Provider  benzonatate (TESSALON) 100 MG capsule Take 1 capsule (100 mg total) by mouth every 8 (eight) hours. 07/30/14   Azalia Bilis, MD  cyclobenzaprine (FLEXERIL) 10 MG tablet Take 0.5 tablets (5 mg total) by mouth 3 (three) times daily as needed for muscle spasms. 07/30/14   Azalia Bilis, MD  ibuprofen (ADVIL,MOTRIN) 600 MG tablet Take 1 tablet (600 mg total) by mouth every 8 (eight) hours as needed. 07/30/14   Azalia Bilis, MD   BP 109/71 mmHg  Pulse 95   Temp(Src) 98.6 F (37 C) (Oral)  Resp 18  SpO2 97% Physical Exam  Constitutional: He is oriented to person, place, and time. He appears well-developed and well-nourished.  HENT:  Head: Normocephalic and atraumatic.  Eyes: EOM are normal.  Neck: Normal range of motion.  Cardiovascular: Normal rate, regular rhythm, normal heart sounds and intact distal pulses.   Pulmonary/Chest: Effort normal and breath sounds normal. No respiratory distress.  Abdominal: Soft. He exhibits no distension. There is no tenderness.  Musculoskeletal: Normal range of motion.  Neurological: He is alert and oriented to person, place, and time.  Skin: Skin is warm and dry.  Psychiatric: He has a normal mood and affect. Judgment normal.  Nursing note and vitals reviewed.   ED Course  Procedures (including critical care time) Labs Review Labs Reviewed - No data to display  Imaging Review Dg Chest 2 View  07/30/2014   CLINICAL DATA:  Cough for 1 week  EXAM: CHEST  2 VIEW  COMPARISON:  01/05/2014  FINDINGS: Cardiac shadow is stable. The lungs are clear bilaterally. Postsurgical changes are noted in the thoracic spine with residual persistent scoliosis stable in appearance. No acute abnormality is seen.  IMPRESSION: No active cardiopulmonary disease.   Electronically Signed   By: Loraine Leriche  Lukens M.D.   On: 07/30/2014 07:20  I personally reviewed the imaging tests through PACS system I reviewed available ER/hospitalization records through the EMR    EKG Interpretation None      MDM   Final diagnoses:  Bronchitis  Right-sided low back pain without sciatica    Likely bronchitis and muscle strain/spasm.  Symptoms treated with anti-inflammatories and muscle relaxants.  Tessalon Perles for cough    Azalia BilisKevin Kimble Delaurentis, MD 07/31/14 [phone]

## 2014-08-17 ENCOUNTER — Emergency Department (HOSPITAL_COMMUNITY)
Admission: EM | Admit: 2014-08-17 | Discharge: 2014-08-17 | Disposition: A | Discharge status: Home / Self Care | Payer: Medicare Other | Attending: Emergency Medicine | Admitting: Emergency Medicine

## 2014-08-17 ENCOUNTER — Emergency Department (HOSPITAL_COMMUNITY): Payer: Medicare Other

## 2014-08-17 ENCOUNTER — Encounter (HOSPITAL_COMMUNITY): Payer: Self-pay | Admitting: Emergency Medicine

## 2014-08-17 DIAGNOSIS — Z8719 Personal history of other diseases of the digestive system: Secondary | ICD-10-CM | POA: Diagnosis not present

## 2014-08-17 DIAGNOSIS — Z720 Tobacco use: Secondary | ICD-10-CM | POA: Insufficient documentation

## 2014-08-17 DIAGNOSIS — Z8679 Personal history of other diseases of the circulatory system: Secondary | ICD-10-CM | POA: Diagnosis not present

## 2014-08-17 DIAGNOSIS — Z8619 Personal history of other infectious and parasitic diseases: Secondary | ICD-10-CM | POA: Diagnosis not present

## 2014-08-17 DIAGNOSIS — R079 Chest pain, unspecified: Secondary | ICD-10-CM | POA: Insufficient documentation

## 2014-08-17 DIAGNOSIS — R05 Cough: Secondary | ICD-10-CM | POA: Diagnosis not present

## 2014-08-17 DIAGNOSIS — Z8739 Personal history of other diseases of the musculoskeletal system and connective tissue: Secondary | ICD-10-CM | POA: Insufficient documentation

## 2014-08-17 MED ORDER — HYDROCODONE-ACETAMINOPHEN 7.5-325 MG/15ML PO SOLN
10.0000 mL | Freq: Once | ORAL | Status: AC
  Administered 2014-08-17: 10 mL via ORAL
  Filled 2014-08-17: qty 15

## 2014-08-17 MED ORDER — METHOCARBAMOL 500 MG PO TABS: 500.0000 mg | ORAL_TABLET | Freq: Four times a day (QID) | ORAL | Status: DC

## 2014-08-17 MED ORDER — HYDROCODONE-ACETAMINOPHEN 7.5-325 MG/15ML PO SOLN: 15.0000 mL | Freq: Four times a day (QID) | ORAL | Status: AC | PRN

## 2014-08-17 MED ORDER — IBUPROFEN 100 MG/5ML PO SUSP: 200.0000 mg | Freq: Four times a day (QID) | ORAL | Status: DC

## 2014-08-17 NOTE — ED Notes (Signed)
Sign language translator called in for pt

## 2014-08-17 NOTE — ED Notes (Signed)
Translator arrived

## 2014-08-17 NOTE — ED Notes (Signed)
Pt states he has pain in the front right side of his chest that is painful when he sighs or yawns  Pt states he has a headache, burning eyes, fatigued, and restless  Pt states he has been taking medication without relief

## 2014-08-17 NOTE — Discharge Instructions (Signed)
Chest Pain (Nonspecific) °It is often hard to give a specific diagnosis for the cause of chest pain. There is always a chance that your pain could be related to something serious, such as a heart attack or a blood clot in the lungs. You need to follow up with your health care provider for further evaluation. °CAUSES  °· Heartburn. °· Pneumonia or bronchitis. °· Anxiety or stress. °· Inflammation around your heart (pericarditis) or lung (pleuritis or pleurisy). °· A blood clot in the lung. °· A collapsed lung (pneumothorax). It can develop suddenly on its own (spontaneous pneumothorax) or from trauma to the chest. °· Shingles infection (herpes zoster virus). °The chest wall is composed of bones, muscles, and cartilage. Any of these can be the source of the pain. °· The bones can be bruised by injury. °· The muscles or cartilage can be strained by coughing or overwork. °· The cartilage can be affected by inflammation and become sore (costochondritis). °DIAGNOSIS  °Lab tests or other studies may be needed to find the cause of your pain. Your health care provider may have you take a test called an ambulatory electrocardiogram (ECG). An ECG records your heartbeat patterns over a 24-hour period. You may also have other tests, such as: °· Transthoracic echocardiogram (TTE). During echocardiography, sound waves are used to evaluate how blood flows through your heart. °· Transesophageal echocardiogram (TEE). °· Cardiac monitoring. This allows your health care provider to monitor your heart rate and rhythm in real time. °· Holter monitor. This is a portable device that records your heartbeat and can help diagnose heart arrhythmias. It allows your health care provider to track your heart activity for several days, if needed. °· Stress tests by exercise or by giving medicine that makes the heart beat faster. °TREATMENT  °· Treatment depends on what may be causing your chest pain. Treatment may include: °· Acid blockers for  heartburn. °· Anti-inflammatory medicine. °· Pain medicine for inflammatory conditions. °· Antibiotics if an infection is present. °· You may be advised to change lifestyle habits. This includes stopping smoking and avoiding alcohol, caffeine, and chocolate. °· You may be advised to keep your head raised (elevated) when sleeping. This reduces the chance of acid going backward from your stomach into your esophagus. °Most of the time, nonspecific chest pain will improve within 2-3 days with rest and mild pain medicine.  °HOME CARE INSTRUCTIONS  °· If antibiotics were prescribed, take them as directed. Finish them even if you start to feel better. °· For the next few days, avoid physical activities that bring on chest pain. Continue physical activities as directed. °· Do not use any tobacco products, including cigarettes, chewing tobacco, or electronic cigarettes. °· Avoid drinking alcohol. °· Only take medicine as directed by your health care provider. °· Follow your health care provider's suggestions for further testing if your chest pain does not go away. °· Keep any follow-up appointments you made. If you do not go to an appointment, you could develop lasting (chronic) problems with pain. If there is any problem keeping an appointment, call to reschedule. °SEEK MEDICAL CARE IF:  °· Your chest pain does not go away, even after treatment. °· You have a rash with blisters on your chest. °· You have a fever. °SEEK IMMEDIATE MEDICAL CARE IF:  °· You have increased chest pain or pain that spreads to your arm, neck, jaw, back, or abdomen. °· You have shortness of breath. °· You have an increasing cough, or you cough   up blood. °· You have severe back or abdominal pain. °· You feel nauseous or vomit. °· You have severe weakness. °· You faint. °· You have chills. °This is an emergency. Do not wait to see if the pain will go away. Get medical help at once. Call your local emergency services (911 in [place] Do not drive  yourself to the hospital. °MAKE SURE YOU:  °· Understand these instructions. °· Will watch your condition. °· Will get help right away if you are not doing well or get worse. °Document Released: 12/06/2004 Document Revised: 03/03/2013 Document Reviewed: 10/02/2007 °Exit[employer]® Patient Information ©2015 [employer]. This information is not intended to replace advice given to you by your health care provider. Make sure you discuss any questions you have with your health care provider. ° °Chest Wall Pain °Chest wall pain is pain in or around the bones and muscles of your chest. It may take up to 6 weeks to get better. It may take longer if you must stay physically active in your work and activities.  °CAUSES  °Chest wall pain may happen on its own. However, it may be caused by: °· A viral illness like the flu. °· Injury. °· Coughing. °· Exercise. °· Arthritis. °· Fibromyalgia. °· Shingles. °HOME CARE INSTRUCTIONS  °· Avoid overtiring physical activity. Try not to strain or perform activities that cause pain. This includes any activities using your chest or your abdominal and side muscles, especially if heavy weights are used. °· Put ice on the sore area. °¨ Put ice in a plastic bag. °¨ Place a towel between your skin and the bag. °¨ Leave the ice on for 15-20 minutes per hour while awake for the first 2 days. °· Only take over-the-counter or prescription medicines for pain, discomfort, or fever as directed by your caregiver. °SEEK IMMEDIATE MEDICAL CARE IF:  °· Your pain increases, or you are very uncomfortable. °· You have a fever. °· Your chest pain becomes worse. °· You have new, unexplained symptoms. °· You have nausea or vomiting. °· You feel sweaty or lightheaded. °· You have a cough with phlegm (sputum), or you cough up blood. °MAKE SURE YOU:  °· Understand these instructions. °· Will watch your condition. °· Will get help right away if you are not doing well or get worse. °Document Released: 02/26/2005 Document  Revised: 05/21/2011 Document Reviewed: 10/23/2010 °ExitCare® Patient Information ©2015 [employer]. This information is not intended to replace advice given to you by your health care provider. Make sure you discuss any questions you have with your health care provider. ° °

## 2014-08-24 NOTE — ED Provider Notes (Signed)
CSN: [id number]     Arrival date & time 08/17/14  0310 History   First MD Initiated Contact with Patient 08/17/14 [phone]     Chief Complaint  Patient presents with  . Chest Pain      HPI  Residual evaluation of right-sided chest pain. Hurts to cough breathing move. States it hurts to turn over in bed. Had a cough for the last several weeks. No left sided pain. No productive cough or hemoptysis. No fevers or chills. No injuries.  Past Medical History  Diagnosis Date  . SVT (supraventricular tachycardia)   . Malabsorption   . [place] measles   . Scoliosis    Past Surgical History  Procedure Laterality Date  . Back surgery      titanium rods for scoliosis  . Tracheostomy    . Gastrostomy w/ feeding tube     History reviewed. No pertinent family history. History  Substance Use Topics  . Smoking status: Current Every Day Smoker -- 0.30 packs/day    Types: Cigarettes  . Smokeless tobacco: Not on file  . Alcohol Use: Yes     Comment: 3-20 oz smirnoff every other day, none in past 3 weeks     Review of Systems  Constitutional: Negative for fever, chills, diaphoresis, appetite change and fatigue.  HENT: Negative for mouth sores, sore throat and trouble swallowing.   Eyes: Negative for visual disturbance.  Respiratory: Positive for cough. Negative for chest tightness, shortness of breath and wheezing.   Cardiovascular: Positive for chest pain.  Gastrointestinal: Negative for nausea, vomiting, abdominal pain, diarrhea and abdominal distention.  Endocrine: Negative for polydipsia, polyphagia and polyuria.  Genitourinary: Negative for dysuria, frequency and hematuria.  Musculoskeletal: Negative for gait problem.  Skin: Negative for color change, pallor and rash.  Neurological: Negative for dizziness, syncope, light-headedness and headaches.  Hematological: Does not bruise/bleed easily.  Psychiatric/Behavioral: Negative for behavioral problems and confusion.      Allergies    Atropine and Albuterol  Home Medications   Prior to Admission medications   Medication Sig Start Date End Date Taking? Authorizing Provider  benzonatate (TESSALON) 100 MG capsule Take 1 capsule (100 mg total) by mouth every 8 (eight) hours. 07/30/14   Azalia Bilis, MD  cyclobenzaprine (FLEXERIL) 10 MG tablet Take 0.5 tablets (5 mg total) by mouth 3 (three) times daily as needed for muscle spasms. 07/30/14   Azalia Bilis, MD  HYDROcodone-acetaminophen (HYCET) 7.5-325 mg/15 ml solution Take 15 mLs by mouth 4 (four) times daily as needed for moderate pain. 08/17/14 08/17/15  Rolland Porter, MD  ibuprofen (ADVIL,MOTRIN) 100 MG/5ML suspension Take 10 mLs (200 mg total) by mouth every 6 (six) hours. 08/17/14   Rolland Porter, MD  methocarbamol (ROBAXIN) 500 MG tablet Take 1 tablet (500 mg total) by mouth 4 (four) times daily. 08/17/14   Rolland Porter, MD   BP 92/61 mmHg  Pulse 75  Temp(Src) 98 F (36.7 C) (Oral)  Resp 16  SpO2 97% Physical Exam  Constitutional: He is oriented to person, place, and time. He appears well-developed and well-nourished. No distress.  HENT:  Head: Normocephalic.  Eyes: Conjunctivae are normal. Pupils are equal, round, and reactive to light. No scleral icterus.  Neck: Normal range of motion. Neck supple. No thyromegaly present.  Cardiovascular: Normal rate and regular rhythm.  Exam reveals no gallop and no friction rub.   No murmur heard. Pulmonary/Chest: Effort normal and breath sounds normal. No respiratory distress. He has no wheezes. He has no rales.  Abdominal: Soft. Bowel sounds are normal. He exhibits no distension. There is no tenderness. There is no rebound.  Musculoskeletal: Normal range of motion.  Neurological: He is alert and oriented to person, place, and time.  Skin: Skin is warm and dry. No rash noted.  Psychiatric: He has a normal mood and affect. His behavior is normal.    ED Course  Procedures (including critical care time) Labs Review Labs Reviewed - No  data to display  Imaging Review No results found.   EKG Interpretation   Date/Time:  Tuesday August 17 2014 03:18:18 EDT Ventricular Rate:  100 PR Interval:  127 QRS Duration: 101 QT Interval:  329 QTC Calculation: 424 R Axis:   157 Text Interpretation:  Sinus tachycardia Right atrial enlargement Right  ventricular hypertrophy ED PHYSICIAN INTERPRETATION AVAILABLE IN [facility] Confirmed by TEST, Record ([id number]) on 08/18/2014 12:52:19 PM      MDM   Final diagnoses:  Chest pain    Pt with apparent chest wall pain.  No PTX or PNA.  No changes on EKG.  plan is expectant management.    Rolland Porter, MD 08/24/14 [phone]

## 2014-10-04 ENCOUNTER — Encounter (HOSPITAL_COMMUNITY): Payer: Self-pay | Admitting: Emergency Medicine

## 2014-10-04 ENCOUNTER — Emergency Department (HOSPITAL_COMMUNITY)
Admission: EM | Admit: 2014-10-04 | Discharge: 2014-10-05 | Disposition: A | Discharge status: Home / Self Care | Payer: Medicare Other | Attending: Emergency Medicine | Admitting: Emergency Medicine

## 2014-10-04 DIAGNOSIS — Z8679 Personal history of other diseases of the circulatory system: Secondary | ICD-10-CM | POA: Diagnosis not present

## 2014-10-04 DIAGNOSIS — Z79899 Other long term (current) drug therapy: Secondary | ICD-10-CM | POA: Diagnosis not present

## 2014-10-04 DIAGNOSIS — R509 Fever, unspecified: Secondary | ICD-10-CM | POA: Diagnosis present

## 2014-10-04 DIAGNOSIS — Z8719 Personal history of other diseases of the digestive system: Secondary | ICD-10-CM | POA: Insufficient documentation

## 2014-10-04 DIAGNOSIS — Z8739 Personal history of other diseases of the musculoskeletal system and connective tissue: Secondary | ICD-10-CM | POA: Insufficient documentation

## 2014-10-04 DIAGNOSIS — R1110 Vomiting, unspecified: Secondary | ICD-10-CM | POA: Insufficient documentation

## 2014-10-04 DIAGNOSIS — Z720 Tobacco use: Secondary | ICD-10-CM | POA: Insufficient documentation

## 2014-10-04 DIAGNOSIS — Z8619 Personal history of other infectious and parasitic diseases: Secondary | ICD-10-CM | POA: Diagnosis not present

## 2014-10-04 DIAGNOSIS — R531 Weakness: Secondary | ICD-10-CM | POA: Diagnosis not present

## 2014-10-04 DIAGNOSIS — J069 Acute upper respiratory infection, unspecified: Secondary | ICD-10-CM | POA: Insufficient documentation

## 2014-10-04 NOTE — ED Notes (Signed)
Pt is deaf  Interrupter called by secretary  Pt wrote a note stating his chest and back hurt when he coughs really hard  Pt states his body feels weak and it is hard for him to walk  Pt states his eyes want to sleep but his body wont let him

## 2014-10-05 MED ORDER — HYDROCOD POLST-CPM POLST ER 10-8 MG/5ML PO SUER
5.0000 mL | Freq: Once | ORAL | Status: AC
  Administered 2014-10-05: 5 mL via ORAL
  Filled 2014-10-05: qty 5

## 2014-10-05 MED ORDER — HYDROCOD POLST-CPM POLST ER 10-8 MG/5ML PO SUER: 5.0000 mL | Freq: Two times a day (BID) | ORAL | Status: DC | PRN

## 2014-10-05 MED ORDER — ACETAMINOPHEN 500 MG PO TABS
1000.0000 mg | ORAL_TABLET | Freq: Once | ORAL | Status: AC
  Administered 2014-10-05: 1000 mg via ORAL
  Filled 2014-10-05: qty 2

## 2014-10-05 MED ORDER — AZITHROMYCIN 200 MG/5ML PO SUSR: 250.0000 mg | Freq: Every day | ORAL | Status: DC

## 2014-10-05 MED ORDER — AZITHROMYCIN 250 MG PO TABS
500.0000 mg | ORAL_TABLET | Freq: Once | ORAL | Status: AC
  Administered 2014-10-05: 500 mg via ORAL
  Filled 2014-10-05: qty 2

## 2014-10-05 NOTE — ED Provider Notes (Signed)
CSN: [id number]     Arrival date & time 10/04/14  2314 History   First MD Initiated Contact with Patient 10/04/14 2344     Chief Complaint  Patient presents with  . Cough  . Fever     (Consider location/radiation/quality/duration/timing/severity/associated sxs/prior Treatment) Patient is a 39 y.o. male presenting with cough and fever. The history is provided by the patient. A language interpreter was used (Deaf interpreter in room).  Cough Cough characteristics:  Dry and hacking Severity:  Moderate Onset quality:  Gradual Duration:  12 weeks Associated symptoms: fever and myalgias   Associated symptoms: no chills, no rash and no sore throat   Associated symptoms comment:  Patient presents with persistent cough, now with pain in left chest wall and right back with coughing. He has had post-tussive vomiting x 3 yesterday. Cough for the past 3 months, diagnosed with bronchitis and placed on cough medication with some relief. Now with worsening symptoms, body aches worse in LE's and generalized weakness.  Fever Associated symptoms: cough, myalgias and vomiting   Associated symptoms: no chills, no congestion, no nausea, no rash and no sore throat     Past Medical History  Diagnosis Date  . SVT (supraventricular tachycardia)   . Malabsorption   . [place] measles   . Scoliosis    Past Surgical History  Procedure Laterality Date  . Back surgery      titanium rods for scoliosis  . Tracheostomy    . Gastrostomy w/ feeding tube     History reviewed. No pertinent family history. History  Substance Use Topics  . Smoking status: Current Every Day Smoker -- 0.30 packs/day    Types: Cigarettes  . Smokeless tobacco: Not on file  . Alcohol Use: Yes     Comment: 3-20 oz smirnoff every other day, none in past 3 weeks     Review of Systems  Constitutional: Positive for fever. Negative for chills.  HENT: Negative.  Negative for congestion and sore throat.   Respiratory: Positive for  cough.   Cardiovascular: Negative.   Gastrointestinal: Positive for vomiting. Negative for nausea and abdominal pain.       See HPI.  Genitourinary: Negative.   Musculoskeletal: Positive for myalgias.  Skin: Negative.  Negative for rash.  Neurological: Positive for weakness.      Allergies  Atropine and Albuterol  Home Medications   Prior to Admission medications   Medication Sig Start Date End Date Taking? Authorizing Provider  benzonatate (TESSALON) 100 MG capsule Take 1 capsule (100 mg total) by mouth every 8 (eight) hours. 07/30/14   Azalia Bilis, MD  cyclobenzaprine (FLEXERIL) 10 MG tablet Take 0.5 tablets (5 mg total) by mouth 3 (three) times daily as needed for muscle spasms. 07/30/14   Azalia Bilis, MD  HYDROcodone-acetaminophen (HYCET) 7.5-325 mg/15 ml solution Take 15 mLs by mouth 4 (four) times daily as needed for moderate pain. 08/17/14 08/17/15  Rolland Porter, MD  ibuprofen (ADVIL,MOTRIN) 100 MG/5ML suspension Take 10 mLs (200 mg total) by mouth every 6 (six) hours. 08/17/14   Rolland Porter, MD  methocarbamol (ROBAXIN) 500 MG tablet Take 1 tablet (500 mg total) by mouth 4 (four) times daily. 08/17/14   Rolland Porter, MD   BP 112/65 mmHg  Pulse 100  Temp(Src) 99.2 F (37.3 C) (Oral)  Resp 18  SpO2 97% Physical Exam  Constitutional: He is oriented to person, place, and time. He appears well-developed and well-nourished.  HENT:  Head: Normocephalic.  Mouth/Throat: Oropharynx is clear and  moist.  Neck: Normal range of motion. Neck supple.  Cardiovascular: Normal rate and regular rhythm.   Pulmonary/Chest: Effort normal and breath sounds normal. He has no wheezes. He has no rales. He exhibits tenderness.  Left lateral chest wall tenderness.   Abdominal: Soft. Bowel sounds are normal. There is no tenderness. There is no rebound and no guarding.  Musculoskeletal: Normal range of motion.  Neurological: He is alert and oriented to person, place, and time.  Skin: Skin is warm and dry. No  rash noted.  Psychiatric: He has a normal mood and affect.    ED Course  Procedures (including critical care time) Labs Review Labs Reviewed - No data to display  Imaging Review No results found.   EKG Interpretation None      MDM   Final diagnoses:  None    1. Uri  Patient febrile URI, fever new to current symptoms of body aches and weakness with persistent/chronic cough. He is a smoker.   Currently feeling better with defervescence. Eating and drinking in the room. Appears nontoxic and NAD. Feel he can be discharged home with antibiotics and cough medications. PCP follow up recommended.     Elpidio Anis, PA-C 10/05/14 [id number]  Loren Racer, MD 10/05/14 [phone]

## 2014-10-05 NOTE — Discharge Instructions (Signed)
Cough, Adult  A cough is a reflex. It helps you clear your throat and airways. A cough can help heal your body. A cough can last 2 or 3 weeks (acute) or may last more than 8 weeks (chronic). Some common causes of a cough can include an infection, allergy, or a cold. HOME CARE  Only take medicine as told by your doctor.  If given, take your medicines (antibiotics) as told. Finish them even if you start to feel better.  Use a cold steam vaporizer or humidifier in your home. This can help loosen thick spit (secretions).  Sleep so you are almost sitting up (semi-upright). Use pillows to do this. This helps reduce coughing.  Rest as needed.  Stop smoking if you smoke. GET HELP RIGHT AWAY IF:  You have yellowish-white fluid (pus) in your thick spit.  Your cough gets worse.  Your medicine does not reduce coughing, and you are losing sleep.  You cough up blood.  You have trouble breathing.  Your pain gets worse and medicine does not help.  You have a fever. MAKE SURE YOU:   Understand these instructions.  Will watch your condition.  Will get help right away if you are not doing well or get worse. Document Released: 11/09/2010 Document Revised: 07/13/2013 Document Reviewed: 11/09/2010 [facility] Patient Information 2015 [place], [place]. This information is not intended to replace advice given to you by your health care provider. Make sure you discuss any questions you have with your health care provider. Fever, Adult A fever is a higher than normal body temperature. In an adult, an oral temperature around 98.6 F (37 C) is considered normal. A temperature of 100.4 F (38 C) or higher is generally considered a fever. Mild or moderate fevers generally have no long-term effects and often do not require treatment. Extreme fever (greater than or equal to 106 F or 41.1 C) can cause seizures. The sweating that may occur with repeated or prolonged fever may cause dehydration. Elderly people  can develop confusion during a fever. A measured temperature can vary with:  Age.  Time of day.  Method of measurement (mouth, underarm, rectal, or ear). The fever is confirmed by taking a temperature with a thermometer. Temperatures can be taken different ways. Some methods are accurate and some are not.  An oral temperature is used most commonly. Electronic thermometers are fast and accurate.  An ear temperature will only be accurate if the thermometer is positioned as recommended by the manufacturer.  A rectal temperature is accurate and done for those adults who have a condition where an oral temperature cannot be taken.  An underarm (axillary) temperature is not accurate and not recommended. Fever is a symptom, not a disease.  CAUSES   Infections commonly cause fever.  Some noninfectious causes for fever include:  Some arthritis conditions.  Some thyroid or adrenal gland conditions.  Some immune system conditions.  Some types of cancer.  A medicine reaction.  High doses of certain street drugs such as methamphetamine.  Dehydration.  Exposure to high outside or room temperatures.  Occasionally, the source of a fever cannot be determined. This is sometimes called a "fever of unknown origin" (FUO).  Some situations may lead to a temporary rise in body temperature that may go away on its own. Examples are:  Childbirth.  Surgery.  Intense exercise. HOME CARE INSTRUCTIONS   Take appropriate medicines for fever. Follow dosing instructions carefully. If you use acetaminophen to reduce the fever, be careful to  avoid taking other medicines that also contain acetaminophen. Do not take aspirin for a fever if you are younger than age 65. There is an association with Reye's syndrome. Reye's syndrome is a rare but potentially deadly disease.  If an infection is present and antibiotics have been prescribed, take them as directed. Finish them even if you start to feel  better.  Rest as needed.  Maintain an adequate fluid intake. To prevent dehydration during an illness with prolonged or recurrent fever, you may need to drink extra fluid.Drink enough fluids to keep your urine clear or pale yellow.  Sponging or bathing with room temperature water may help reduce body temperature. Do not use ice water or alcohol sponge baths.  Dress comfortably, but do not over-bundle. SEEK MEDICAL CARE IF:   You are unable to keep fluids down.  You develop vomiting or diarrhea.  You are not feeling at least partly better after 3 days.  You develop new symptoms or problems. SEEK IMMEDIATE MEDICAL CARE IF:   You have shortness of breath or trouble breathing.  You develop excessive weakness.  You are dizzy or you faint.  You are extremely thirsty or you are making little or no urine.  You develop new pain that was not there before (such as in the head, neck, chest, back, or abdomen).  You have persistent vomiting and diarrhea for more than 1 to 2 days.  You develop a stiff neck or your eyes become sensitive to light.  You develop a skin rash.  You have a fever or persistent symptoms for more than 2 to 3 days.  You have a fever and your symptoms suddenly get worse. MAKE SURE YOU:   Understand these instructions.  Will watch your condition.  Will get help right away if you are not doing well or get worse. Document Released: 08/22/2000 Document Revised: 07/13/2013 Document Reviewed: 12/28/2010 [facility] Patient Information 2015 [place], [place]. This information is not intended to replace advice given to you by your health care provider. Make sure you discuss any questions you have with your health care provider.

## 2014-11-18 ENCOUNTER — Emergency Department (HOSPITAL_COMMUNITY): Payer: Medicare Other

## 2014-11-18 ENCOUNTER — Emergency Department (HOSPITAL_COMMUNITY)
Admission: EM | Admit: 2014-11-18 | Discharge: 2014-11-18 | Disposition: A | Discharge status: Home / Self Care | Payer: Medicare Other | Attending: Emergency Medicine | Admitting: Emergency Medicine

## 2014-11-18 ENCOUNTER — Encounter (HOSPITAL_COMMUNITY): Payer: Self-pay | Admitting: Emergency Medicine

## 2014-11-18 DIAGNOSIS — Z8739 Personal history of other diseases of the musculoskeletal system and connective tissue: Secondary | ICD-10-CM | POA: Diagnosis not present

## 2014-11-18 DIAGNOSIS — R739 Hyperglycemia, unspecified: Secondary | ICD-10-CM | POA: Diagnosis not present

## 2014-11-18 DIAGNOSIS — Z8719 Personal history of other diseases of the digestive system: Secondary | ICD-10-CM | POA: Insufficient documentation

## 2014-11-18 DIAGNOSIS — N390 Urinary tract infection, site not specified: Secondary | ICD-10-CM | POA: Insufficient documentation

## 2014-11-18 DIAGNOSIS — Z8619 Personal history of other infectious and parasitic diseases: Secondary | ICD-10-CM | POA: Insufficient documentation

## 2014-11-18 DIAGNOSIS — Z720 Tobacco use: Secondary | ICD-10-CM | POA: Insufficient documentation

## 2014-11-18 DIAGNOSIS — Z8679 Personal history of other diseases of the circulatory system: Secondary | ICD-10-CM | POA: Insufficient documentation

## 2014-11-18 DIAGNOSIS — R1110 Vomiting, unspecified: Secondary | ICD-10-CM | POA: Diagnosis present

## 2014-11-18 LAB — COMPREHENSIVE METABOLIC PANEL
ALT: 14 U/L — AB (ref 17–63)
AST: 28 U/L (ref 15–41)
Albumin: 3.7 g/dL (ref 3.5–5.0)
Alkaline Phosphatase: 86 U/L (ref 38–126)
Anion gap: 6 (ref 5–15)
BILIRUBIN TOTAL: 0.5 mg/dL (ref 0.3–1.2)
BUN: 9 mg/dL (ref 6–20)
CALCIUM: 9.2 mg/dL (ref 8.9–10.3)
CO2: 28 mmol/L (ref 22–32)
CREATININE: 0.71 mg/dL (ref 0.61–1.24)
Chloride: 103 mmol/L (ref 101–111)
GFR calc Af Amer: >60 mL/min (ref >60)
GLUCOSE: 139 mg/dL — AB (ref 65–99)
Potassium: 4.2 mmol/L (ref 3.5–5.1)
Sodium: 137 mmol/L (ref 135–145)
TOTAL PROTEIN: 7.7 g/dL (ref 6.5–8.1)

## 2014-11-18 LAB — CBC WITH DIFFERENTIAL/PLATELET
Basophils Absolute: 0 10*3/uL (ref 0.0–0.1)
Basophils Relative: 0 % (ref 0–1)
Eosinophils Absolute: 0.2 10*3/uL (ref 0.0–0.7)
Eosinophils Relative: 2 % (ref 0–5)
HEMATOCRIT: 47.4 % (ref 39.0–52.0)
Hemoglobin: 16.4 g/dL (ref 13.0–17.0)
LYMPHS PCT: 9 % — AB (ref 12–46)
Lymphs Abs: 1 10*3/uL (ref 0.7–4.0)
MCH: 34.2 pg — ABNORMAL HIGH (ref 26.0–34.0)
MCHC: 34.6 g/dL (ref 30.0–36.0)
MCV: 99 fL (ref 78.0–100.0)
Monocytes Absolute: 1.4 10*3/uL — ABNORMAL HIGH (ref 0.1–1.0)
Monocytes Relative: 12 % (ref 3–12)
NEUTROS ABS: 8.4 10*3/uL — AB (ref 1.7–7.7)
NEUTROS PCT: 77 % (ref 43–77)
Platelets: 251 10*3/uL (ref 150–400)
RBC: 4.79 MIL/uL (ref 4.22–5.81)
RDW: 14.6 % (ref 11.5–15.5)
WBC: 11 10*3/uL — ABNORMAL HIGH (ref 4.0–10.5)

## 2014-11-18 LAB — URINALYSIS, ROUTINE W REFLEX MICROSCOPIC
BILIRUBIN URINE: NEGATIVE
GLUCOSE, UA: NEGATIVE mg/dL
Ketones, ur: NEGATIVE mg/dL
NITRITE: POSITIVE — AB
PH: 6.5 (ref 5.0–8.0)
Protein, ur: NEGATIVE mg/dL
SPECIFIC GRAVITY, URINE: 1.017 (ref 1.005–1.030)
Urobilinogen, UA: 1 mg/dL (ref 0.0–1.0)

## 2014-11-18 LAB — CBG MONITORING, ED: GLUCOSE-CAPILLARY: 132 mg/dL — AB (ref 65–99)

## 2014-11-18 LAB — URINE MICROSCOPIC-ADD ON

## 2014-11-18 LAB — LIPASE, BLOOD: Lipase: 19 U/L — ABNORMAL LOW (ref 22–51)

## 2014-11-18 MED ORDER — NAPROXEN 375 MG PO TABS: ORAL_TABLET | ORAL | Status: DC

## 2014-11-18 MED ORDER — CEPHALEXIN 500 MG PO CAPS: 500.0000 mg | ORAL_CAPSULE | Freq: Two times a day (BID) | ORAL | Status: DC

## 2014-11-18 MED ORDER — SODIUM CHLORIDE 0.9 % IV BOLUS (SEPSIS)
500.0000 mL | Freq: Once | INTRAVENOUS | Status: AC
  Administered 2014-11-18: 500 mL via INTRAVENOUS

## 2014-11-18 MED ORDER — DEXTROSE 5 % IV SOLN
1.0000 g | Freq: Once | INTRAVENOUS | Status: AC
  Administered 2014-11-18: 1 g via INTRAVENOUS
  Filled 2014-11-18: qty 10

## 2014-11-18 MED ORDER — ONDANSETRON HCL 4 MG/2ML IJ SOLN
4.0000 mg | Freq: Once | INTRAMUSCULAR | Status: DC
  Filled 2014-11-18: qty 2

## 2014-11-18 NOTE — ED Provider Notes (Signed)
CSN: [id number]     Arrival date & time 11/18/14  1918 History   First MD Initiated Contact with Patient 11/18/14 1951     Chief Complaint  Patient presents with  . Emesis     (Consider location/radiation/quality/duration/timing/severity/associated sxs/prior Treatment) HPI   Richard York is a(n) 39 y.o. male who presents to the ED with cc of fever. Patient is deaf and speaks ASL. Translator is present for HPI. Patient states that he has been feeling poorly for the past several days. 3 days ago the patient developed a productive cough. He endorses frequent urination and pain in his right lower back. Patient has a history of lower back pain and severe spinal scoliosis with previous repair. Patient also has a history of pneumonia and bronchitis. He smokes about a pack of cigarettes a week. Patient states that today he drank a chocolate Ensure and then vomited 4 times afterward. He denies any other abdominal symptoms such as abdominal pain, diarrhea. Patient has no nausea at this time. He denies a history of kidney stones. He denies a history of urinary tract infections.  Past Medical History  Diagnosis Date  . SVT (supraventricular tachycardia)   . Malabsorption   . [place] measles   . Scoliosis    Past Surgical History  Procedure Laterality Date  . Back surgery      titanium rods for scoliosis  . Tracheostomy    . Gastrostomy w/ feeding tube     History reviewed. No pertinent family history. Social History  Substance Use Topics  . Smoking status: Current Every Day Smoker -- 0.30 packs/day    Types: Cigarettes  . Smokeless tobacco: None  . Alcohol Use: Yes     Comment: occassional    Review of Systems    Allergies  Atropine and Albuterol  Home Medications   Prior to Admission medications   Medication Sig Start Date End Date Taking? Authorizing Provider  benzonatate (TESSALON) 100 MG capsule Take 1 capsule (100 mg total) by mouth every 8 (eight) hours. 07/30/14  Yes  Azalia Bilis, MD  chlorpheniramine-HYDROcodone [facility]) 10-8 MG/5ML SUER Take 5 mLs by mouth every 12 (twelve) hours as needed for cough. 10/05/14  Yes Shari Upstill, PA-C  guaifenesin (ROBITUSSIN) 100 MG/5ML syrup Take 200 mg by mouth 3 (three) times daily as needed for cough.   Yes Historical Provider, MD  ibuprofen (ADVIL,MOTRIN) 600 MG tablet Take 600 mg by mouth every 8 (eight) hours as needed for moderate pain.   Yes Historical Provider, MD  azithromycin (ZITHROMAX) 200 MG/5ML suspension Take 6.3 mLs (250 mg total) by mouth daily. Patient not taking: Reported on 11/18/2014 10/05/14   Elpidio Anis, PA-C  cyclobenzaprine (FLEXERIL) 10 MG tablet Take 0.5 tablets (5 mg total) by mouth 3 (three) times daily as needed for muscle spasms. 07/30/14   Azalia Bilis, MD  HYDROcodone-acetaminophen (HYCET) 7.5-325 mg/15 ml solution Take 15 mLs by mouth 4 (four) times daily as needed for moderate pain. Patient not taking: Reported on 11/18/2014 08/17/14 08/17/15  Rolland Porter, MD  ibuprofen (ADVIL,MOTRIN) 100 MG/5ML suspension Take 10 mLs (200 mg total) by mouth every 6 (six) hours. Patient not taking: Reported on 10/05/2014 08/17/14   Rolland Porter, MD  methocarbamol (ROBAXIN) 500 MG tablet Take 1 tablet (500 mg total) by mouth 4 (four) times daily. Patient not taking: Reported on 11/18/2014 08/17/14   Rolland Porter, MD   BP 122/73 mmHg  Pulse 105  Temp(Src) 98 F (36.7 C) (Oral)  Resp  18  SpO2 99% Physical Exam  Constitutional: No distress.  Poorly developed, multiple skeletal deformities. He appears pretty malnourished, very thin.  HENT:  Head: Normocephalic and atraumatic.  Poor dentition  Eyes: Conjunctivae are normal. No scleral icterus.  Neck: Normal range of motion. Neck supple.  Cardiovascular: Normal rate, regular rhythm and normal heart sounds.   Pulmonary/Chest: Effort normal. No respiratory distress.  Rhonchi that do not clear with cough  Abdominal: Soft. There is no tenderness.   Musculoskeletal: He exhibits no edema.  Neurological: He is alert.  Skin: Skin is warm and dry. He is not diaphoretic.  Psychiatric: His behavior is normal.  Nursing note and vitals reviewed.   ED Course  Procedures (including critical care time) Labs Review Labs Reviewed  CBC WITH DIFFERENTIAL/PLATELET - Abnormal; Notable for the following:    WBC 11.0 (*)    MCH 34.2 (*)    Neutro Abs 8.4 (*)    Lymphocytes Relative 9 (*)    Monocytes Absolute 1.4 (*)    All other components within normal limits  COMPREHENSIVE METABOLIC PANEL - Abnormal; Notable for the following:    Glucose, Bld 139 (*)    ALT 14 (*)    All other components within normal limits  LIPASE, BLOOD - Abnormal; Notable for the following:    Lipase 19 (*)    All other components within normal limits  URINALYSIS, ROUTINE W REFLEX MICROSCOPIC (NOT AT [facility]) - Abnormal; Notable for the following:    APPearance CLOUDY (*)    Hgb urine dipstick TRACE (*)    Nitrite POSITIVE (*)    Leukocytes, UA MODERATE (*)    All other components within normal limits  URINE MICROSCOPIC-ADD ON - Abnormal; Notable for the following:    Bacteria, UA MANY (*)    All other components within normal limits    Imaging Review No results found. I have personally reviewed and evaluated these images and lab results as part of my medical decision-making.   EKG Interpretation None      MDM   Final diagnoses:  Urinary tract infection without hematuria, site unspecified  Hyperglycemia    11:04 PM Patient with UTI, negative cxr. Elevated white count. No vomting in ED.  UA sent for culture. Rocephin here in the ED. HDS. Appears safe for discharge.    Arthor Captain, PA-C 11/19/14 0040  Samuel Jester, DO 11/21/14 1108

## 2014-11-18 NOTE — ED Notes (Signed)
Patient denies nausea at present.  Zofran held. 

## 2014-11-18 NOTE — ED Notes (Addendum)
Interpreter at bedside.  Patient reports temperature and vomiting x4 episodes today.  Reports feeling bad with intermittent fever x 3 days.  Reports lower back soreness x 2 months and chills.  Reports cough x 3 days.

## 2014-11-18 NOTE — Discharge Instructions (Signed)
Your chest xray was negative  Urinary Tract Infection Urinary tract infections (UTIs) can develop anywhere along your urinary tract. Your urinary tract is your body's drainage system for removing wastes and extra water. Your urinary tract includes two kidneys, two ureters, a bladder, and a urethra. Your kidneys are a pair of bean-shaped organs. Each kidney is about the size of your fist. They are located below your ribs, one on each side of your spine. CAUSES Infections are caused by microbes, which are microscopic organisms, including fungi, viruses, and bacteria. These organisms are so small that they can only be seen through a microscope. Bacteria are the microbes that most commonly cause UTIs. SYMPTOMS  Symptoms of UTIs may vary by age and gender of the patient and by the location of the infection. Symptoms in young women typically include a frequent and intense urge to urinate and a painful, burning feeling in the bladder or urethra during urination. Older women and men are more likely to be tired, shaky, and weak and have muscle aches and abdominal pain. A fever may mean the infection is in your kidneys. Other symptoms of a kidney infection include pain in your back or sides below the ribs, nausea, and vomiting. DIAGNOSIS To diagnose a UTI, your caregiver will ask you about your symptoms. Your caregiver also will ask to provide a urine sample. The urine sample will be tested for bacteria and white blood cells. White blood cells are made by your body to help fight infection. TREATMENT  Typically, UTIs can be treated with medication. Because most UTIs are caused by a bacterial infection, they usually can be treated with the use of antibiotics. The choice of antibiotic and length of treatment depend on your symptoms and the type of bacteria causing your infection. HOME CARE INSTRUCTIONS  If you were prescribed antibiotics, take them exactly as your caregiver instructs you. Finish the medication even  if you feel better after you have only taken some of the medication.  Drink enough water and fluids to keep your urine clear or pale yellow.  Avoid caffeine, tea, and carbonated beverages. They tend to irritate your bladder.  Empty your bladder often. Avoid holding urine for long periods of time.  Empty your bladder before and after sexual intercourse.  After a bowel movement, women should cleanse from front to back. Use each tissue only once. SEEK MEDICAL CARE IF:   You have back pain.  You develop a fever.  Your symptoms do not begin to resolve within 3 days. SEEK IMMEDIATE MEDICAL CARE IF:   You have severe back pain or lower abdominal pain.  You develop chills.  You have nausea or vomiting.  You have continued burning or discomfort with urination. MAKE SURE YOU:   Understand these instructions.  Will watch your condition.  Will get help right away if you are not doing well or get worse. Document Released: 12/06/2004 Document Revised: 08/28/2011 Document Reviewed: 04/06/2011 [facility] Patient Information 2015 [place], [place]. This information is not intended to replace advice given to you by your health care provider. Make sure you discuss any questions you have with your health care provider.  Hyperglycemia Hyperglycemia occurs when the glucose (sugar) in your blood is too high. Hyperglycemia can happen for many reasons, but it most often happens to people who do not know they have diabetes or are not managing their diabetes properly.  CAUSES  Whether you have diabetes or not, there are other causes of hyperglycemia. Hyperglycemia can occur when you  have diabetes, but it can also occur in other situations that you might not be as aware of, such as: Diabetes  If you have diabetes and are having problems controlling your blood glucose, hyperglycemia could occur because of some of the following reasons:  Not following your meal plan.  Not taking your diabetes  medications or not taking it properly.  Exercising less or doing less activity than you normally do.  Being sick. Pre-diabetes  This cannot be ignored. Before people develop Type 2 diabetes, they almost always have "pre-diabetes." This is when your blood glucose levels are higher than normal, but not yet high enough to be diagnosed as diabetes. Research has shown that some long-term damage to the body, especially the heart and circulatory system, may already be occurring during pre-diabetes. If you take action to manage your blood glucose when you have pre-diabetes, you may delay or prevent Type 2 diabetes from developing. Stress  If you have diabetes, you may be "diet" controlled or on oral medications or insulin to control your diabetes. However, you may find that your blood glucose is higher than usual in the hospital whether you have diabetes or not. This is often referred to as "stress hyperglycemia." Stress can elevate your blood glucose. This happens because of hormones put out by the body during times of stress. If stress has been the cause of your high blood glucose, it can be followed regularly by your caregiver. That way he/she can make sure your hyperglycemia does not continue to get worse or progress to diabetes. Steroids  Steroids are medications that act on the infection fighting system (immune system) to block inflammation or infection. One side effect can be a rise in blood glucose. Most people can produce enough extra insulin to allow for this rise, but for those who cannot, steroids make blood glucose levels go even higher. It is not unusual for steroid treatments to "uncover" diabetes that is developing. It is not always possible to determine if the hyperglycemia will go away after the steroids are stopped. A special blood test called an A1c is sometimes done to determine if your blood glucose was elevated before the steroids were started. SYMPTOMS  Thirsty.  Frequent  urination.  Dry mouth.  Blurred vision.  Tired or fatigue.  Weakness.  Sleepy.  Tingling in feet or leg. DIAGNOSIS  Diagnosis is made by monitoring blood glucose in one or all of the following ways:  A1c test. This is a chemical found in your blood.  Fingerstick blood glucose monitoring.  Laboratory results. TREATMENT  First, knowing the cause of the hyperglycemia is important before the hyperglycemia can be treated. Treatment may include, but is not be limited to:  Education.  Change or adjustment in medications.  Change or adjustment in meal plan.  Treatment for an illness, infection, etc.  More frequent blood glucose monitoring.  Change in exercise plan.  Decreasing or stopping steroids.  Lifestyle changes. HOME CARE INSTRUCTIONS   Test your blood glucose as directed.  Exercise regularly. Your caregiver will give you instructions about exercise. Pre-diabetes or diabetes which comes on with stress is helped by exercising.  Eat wholesome, balanced meals. Eat often and at regular, fixed times. Your caregiver or nutritionist will give you a meal plan to guide your sugar intake.  Being at an ideal weight is important. If needed, losing as little as 10 to 15 pounds may help improve blood glucose levels. SEEK MEDICAL CARE IF:   You have questions about medicine,  activity, or diet.  You continue to have symptoms (problems such as increased thirst, urination, or weight gain). SEEK IMMEDIATE MEDICAL CARE IF:   You are vomiting or have diarrhea.  Your breath smells fruity.  You are breathing faster or slower.  You are very sleepy or incoherent.  You have numbness, tingling, or pain in your feet or hands.  You have chest pain.  Your symptoms get worse even though you have been following your caregiver's orders.  If you have any other questions or concerns. Document Released: 08/22/2000 Document Revised: 05/21/2011 Document Reviewed: 06/25/2011 [facility]  Patient Information 2015 [place], [place]. This information is not intended to replace advice given to you by your health care provider. Make sure you discuss any questions you have with your health care provider.

## 2014-11-18 NOTE — ED Notes (Signed)
PA at the bedside.

## 2014-11-18 NOTE — ED Notes (Signed)
Interpreter will be here by 8pm

## 2014-11-21 LAB — URINE CULTURE
Culture: >=100000
SPECIAL REQUESTS: NORMAL

## 2014-11-22 ENCOUNTER — Telehealth (HOSPITAL_COMMUNITY): Payer: Self-pay

## 2014-11-22 NOTE — Telephone Encounter (Signed)
Post ED Visit - Positive Culture Follow-up  Culture report reviewed by antimicrobial stewardship pharmacist:   Celedonio Miyamoto, Pharm.D., BCPS  Georgina Pillion, Pharm.D., BCPS  [place], [place].D., BCPS, AAHIVP  Estella Husk, Pharm.D., BCPS, AAHIVP  Cristy Reyes, [address].D.  Cassie Roseanne Reno, [place].D.  Positive urine culture Treated with cephalexin, organism sensitive to the same and no further patient follow-up is required at this time.  Ashley Jacobs 11/22/2014, 9:31 AM

## 2015-08-08 ENCOUNTER — Encounter (HOSPITAL_COMMUNITY): Payer: Self-pay | Admitting: Emergency Medicine

## 2015-08-08 ENCOUNTER — Emergency Department (HOSPITAL_COMMUNITY)
Admission: EM | Admit: 2015-08-08 | Discharge: 2015-08-09 | Disposition: A | Discharge status: Home / Self Care | Payer: Medicare Other | Attending: Emergency Medicine | Admitting: Emergency Medicine

## 2015-08-08 ENCOUNTER — Emergency Department (HOSPITAL_COMMUNITY): Payer: Medicare Other

## 2015-08-08 DIAGNOSIS — F17210 Nicotine dependence, cigarettes, uncomplicated: Secondary | ICD-10-CM | POA: Insufficient documentation

## 2015-08-08 DIAGNOSIS — K529 Noninfective gastroenteritis and colitis, unspecified: Secondary | ICD-10-CM | POA: Diagnosis not present

## 2015-08-08 DIAGNOSIS — R1084 Generalized abdominal pain: Secondary | ICD-10-CM | POA: Diagnosis present

## 2015-08-08 DIAGNOSIS — R109 Unspecified abdominal pain: Secondary | ICD-10-CM

## 2015-08-08 LAB — CBC
HCT: 44.5 % (ref 39.0–52.0)
HEMOGLOBIN: 15.8 g/dL (ref 13.0–17.0)
MCH: 34.6 pg — ABNORMAL HIGH (ref 26.0–34.0)
MCHC: 35.5 g/dL (ref 30.0–36.0)
MCV: 97.4 fL (ref 78.0–100.0)
Platelets: 289 10*3/uL (ref 150–400)
RBC: 4.57 MIL/uL (ref 4.22–5.81)
RDW: 13.9 % (ref 11.5–15.5)
WBC: 7.1 10*3/uL (ref 4.0–10.5)

## 2015-08-08 LAB — COMPREHENSIVE METABOLIC PANEL
ALK PHOS: 93 U/L (ref 38–126)
ALT: 24 U/L (ref 17–63)
ANION GAP: 6 (ref 5–15)
AST: 29 U/L (ref 15–41)
Albumin: 4 g/dL (ref 3.5–5.0)
BILIRUBIN TOTAL: 0.4 mg/dL (ref 0.3–1.2)
BUN: 11 mg/dL (ref 6–20)
CALCIUM: 8.8 mg/dL — AB (ref 8.9–10.3)
CO2: 29 mmol/L (ref 22–32)
Chloride: 98 mmol/L — ABNORMAL LOW (ref 101–111)
Creatinine, Ser: 0.75 mg/dL (ref 0.61–1.24)
Glucose, Bld: 95 mg/dL (ref 65–99)
Potassium: 3.9 mmol/L (ref 3.5–5.1)
SODIUM: 133 mmol/L — AB (ref 135–145)
TOTAL PROTEIN: 7.3 g/dL (ref 6.5–8.1)

## 2015-08-08 LAB — LIPASE, BLOOD: Lipase: 21 U/L (ref 11–51)

## 2015-08-08 MED ORDER — DIATRIZOATE MEGLUMINE & SODIUM 66-10 % PO SOLN
15.0000 mL | Freq: Once | ORAL | Status: AC
  Administered 2015-08-08: 15 mL via ORAL

## 2015-08-08 MED ORDER — KETOROLAC TROMETHAMINE 30 MG/ML IJ SOLN
30.0000 mg | Freq: Once | INTRAMUSCULAR | Status: AC
  Administered 2015-08-08: 30 mg via INTRAVENOUS
  Filled 2015-08-08: qty 1

## 2015-08-08 MED ORDER — IOPAMIDOL (ISOVUE-300) INJECTION 61%
75.0000 mL | Freq: Once | INTRAVENOUS | Status: AC | PRN
  Administered 2015-08-08: 75 mL via INTRAVENOUS

## 2015-08-08 MED ORDER — ONDANSETRON HCL 4 MG/2ML IJ SOLN
4.0000 mg | Freq: Once | INTRAMUSCULAR | Status: AC
  Administered 2015-08-08: 4 mg via INTRAVENOUS
  Filled 2015-08-08: qty 2

## 2015-08-08 MED ORDER — SODIUM CHLORIDE 0.9 % IV BOLUS (SEPSIS)
1000.0000 mL | Freq: Once | INTRAVENOUS | Status: AC
  Administered 2015-08-08: 1000 mL via INTRAVENOUS

## 2015-08-08 NOTE — ED Notes (Signed)
Pt states that he has had abdominal swelling and pain x 1-2 weeks with diarrhea and gas. Alert and oriented. Pt is deaf and needs sign language interpreter.

## 2015-08-08 NOTE — ED Provider Notes (Signed)
CSN: [id number]     Arrival date & time 08/08/15  1959 History   First MD Initiated Contact with Patient 08/08/15 2020     Chief Complaint  Patient presents with  . Abdominal Pain   HPI  [name] is a 40 year old male with PMHx SVT, malnutrition, scoliosis and [place] measles presenting with abdominal bloating, pain and diarrhea. 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 Pt reports onset of symptoms two weeks ago. He states that his abdomen feels bloated and appears more distended. This is worse after meals. He also endorses generalized, aching abdominal pain. This is intermittent and also worse after meals. He notes multiple episodes of diarrhea per day. States that they typically occur after eating. Denies blood in his stool. Denies suspicious food intake or recent travel. He notes passing more gas than usual. He also endorses two episode of vomiting in the past 2 weeks. Denies blood in his vomit. Denies history of GI problems. There is a documented history of malabsorption and remote gastrostomy with tube feeds. No tube feeds at present. He has tried ibuprofen for his symptoms without relief. Denies fevers, chills, dizziness, syncope, chest pain, reflux symptoms, shortness of breath, flank pain, dysuria or testicular pain/swelling.  Past Medical History  Diagnosis Date  . SVT (supraventricular tachycardia) (HCC)   . Malabsorption   . [place] measles   . Scoliosis    Past Surgical History  Procedure Laterality Date  . Back surgery      titanium rods for scoliosis  . Tracheostomy    . Gastrostomy w/ feeding tube     History reviewed. No pertinent family history. Social History  Substance Use Topics  . Smoking status: Current Every Day Smoker -- 0.30 packs/day    Types: Cigarettes  . Smokeless tobacco: None  . Alcohol Use: Yes     Comment: occassional    Review of Systems  All other systems reviewed and are negative.     Allergies  Atropine and Albuterol  Home  Medications   Prior to Admission medications   Medication Sig Start Date End Date Taking? Authorizing Provider  ibuprofen (ADVIL,MOTRIN) 100 MG/5ML suspension Take 10 mLs (200 mg total) by mouth every 6 (six) hours. 08/17/14  Yes Rolland Porter, MD  naproxen (NAPROSYN) 375 MG tablet Take one tablet with food as needed for back pain. You may take one tablet every 12 hours. 11/18/14  Yes Arthor Captain, PA-C  azithromycin (ZITHROMAX) 200 MG/5ML suspension Take 6.3 mLs (250 mg total) by mouth daily. Patient not taking: Reported on 11/18/2014 10/05/14   Elpidio Anis, PA-C  benzonatate (TESSALON) 100 MG capsule Take 1 capsule (100 mg total) by mouth every 8 (eight) hours. 07/30/14   Azalia Bilis, MD  cephALEXin (KEFLEX) 500 MG capsule Take 1 capsule (500 mg total) by mouth 2 (two) times daily. 11/18/14   Arthor Captain, PA-C  chlorpheniramine-HYDROcodone (TUSSIONEX PENNKINETIC ER) 10-8 MG/5ML SUER Take 5 mLs by mouth every 12 (twelve) hours as needed for cough. 10/05/14   Elpidio Anis, PA-C  ciprofloxacin (CIPRO) 500 MG tablet Take 1 tablet (500 mg total) by mouth 2 (two) times daily. 08/09/15   Cherita Hebel, PA-C  cyclobenzaprine (FLEXERIL) 10 MG tablet Take 0.5 tablets (5 mg total) by mouth 3 (three) times daily as needed for muscle spasms. 07/30/14   Azalia Bilis, MD  guaifenesin (ROBITUSSIN) 100 MG/5ML syrup Take 200 mg by mouth 3 (three) times daily as needed for cough.    Historical Provider, MD  HYDROcodone-acetaminophen (HYCET) 7.5-325 mg/15 ml solution Take 15 mLs by mouth 4 (four) times daily as needed for moderate pain. Patient not taking: Reported on 11/18/2014 08/17/14 08/17/15  Rolland Porter, MD  ibuprofen (ADVIL,MOTRIN) 600 MG tablet Take 600 mg by mouth every 8 (eight) hours as needed for moderate pain.    Historical Provider, MD  methocarbamol (ROBAXIN) 500 MG tablet Take 1 tablet (500 mg total) by mouth 4 (four) times daily. Patient not taking: Reported on 11/18/2014 08/17/14   Rolland Porter, MD  metroNIDAZOLE  (FLAGYL) 500 MG tablet Take 1 tablet (500 mg total) by mouth 2 (two) times daily. 08/09/15   Tayelor Osborne, PA-C  ondansetron (ZOFRAN) 4 MG tablet Take 1 tablet (4 mg total) by mouth every 6 (six) hours. 08/09/15   Deshara Rossi, PA-C   BP 141/92 mmHg  Pulse 66  Temp(Src) 98.2 F (36.8 C) (Oral)  Resp 16  SpO2 96% Physical Exam  Constitutional: He appears well-developed and well-nourished. No distress.  Nontoxic-appearing  HENT:  Head: Normocephalic and atraumatic.  Eyes: Conjunctivae are normal. Right eye exhibits no discharge. Left eye exhibits no discharge. No scleral icterus.  Neck: Normal range of motion.  Cardiovascular: Normal rate, regular rhythm and normal heart sounds.   Pulmonary/Chest: Effort normal and breath sounds normal. No respiratory distress.  Abdominal: Soft. Bowel sounds are normal. He exhibits no distension. There is no tenderness. There is no rebound and no guarding.  Abdomen is soft and nontender. No significant distention noted. No peritoneal signs. Normoactive bowel sounds.  Genitourinary:  Testicles are descended and nontender.  Musculoskeletal: Normal range of motion.  Neurological: He is alert. Coordination normal.  Skin: Skin is warm and dry.  Psychiatric: He has a normal mood and affect. His behavior is normal.  Nursing note and vitals reviewed.   ED Course  Procedures (including critical care time) Labs Review Labs Reviewed  COMPREHENSIVE METABOLIC PANEL - Abnormal; Notable for the following:    Sodium 133 (*)    Chloride 98 (*)    Calcium 8.8 (*)    All other components within normal limits  CBC - Abnormal; Notable for the following:    MCH 34.6 (*)    All other components within normal limits  URINALYSIS, ROUTINE W REFLEX MICROSCOPIC (NOT AT [facility]) - Abnormal; Notable for the following:    APPearance CLOUDY (*)    All other components within normal limits  LIPASE, BLOOD    Imaging Review Ct Abdomen Pelvis W Contrast  08/08/2015  CLINICAL  DATA:  Abdominal pain for 1 week.  Diarrhea. EXAM: CT ABDOMEN AND PELVIS WITH CONTRAST TECHNIQUE: Multidetector CT imaging of the abdomen and pelvis was performed using the standard protocol following bolus administration of intravenous contrast. CONTRAST:  75mL ISOVUE-300 IOPAMIDOL (ISOVUE-300) INJECTION 61% COMPARISON:  None. FINDINGS: Lower chest: Distorted lower thorax anatomy due to a scoliosis. No consolidation or pleural effusion. Liver: No focal lesion. Hepatobiliary: Gallbladder not definitively seen, likely completely decompressed. Recommend correlation for cholecystectomy. Pancreas: No ductal dilatation or inflammation. Spleen: Normal. Adrenal glands: No nodule. Kidneys: Symmetric renal enhancement. No hydronephrosis. Portions of the kidneys are obscured by streak artifact from adjacent hardware. Stomach/Bowel: Stomach physiologically distended. There are no dilated or thickened small bowel loops. Portions of the colon are decompressed with probable mild wall thickening at the splenic flexure and sigmoid colon. Small volume of stool throughout the remainder of the colon. No definite perienteric of tissue edema. The appendix is normal. Vascular/Lymphatic: No retroperitoneal adenopathy. Abdominal aorta is normal  in caliber. Reproductive: Normal sized prostate gland. Left spermatic cord not definitively seen. No cryptorchid testis is seen. Bladder: Minimally distended, no definite wall thickening. Other: No free air, free fluid, or intra-abdominal fluid collection. Musculoskeletal: Posterior fusion thoracic and upper lumbar spine. There are no acute or suspicious osseous abnormalities. IMPRESSION: 1. Equivocal mild colonic wall thickening involving the splenic flexure and sigmoid colon, can be seen with mild colitis in the setting of diarrhea. 2. No additional acute abnormality in the abdomen/pelvis. 3. Left spermatic cord is not well visualized, recommend clinical correlation to determine if left testis is  in the scrotal sac. Electronically Signed   By: Rubye OaksMelanie  Ehinger M.D.   On: 08/08/2015 23:37   I have personally reviewed and evaluated these images and lab results as part of my medical decision-making.   EKG Interpretation None      MDM   Final diagnoses:  Abdominal pain  Colitis   40 year old male presenting with abdominal pain, diarrhea and increased gas. Afebrile and nontoxic appearing. Mildly hypertensive. Abdomen is soft, nontender without peritoneal signs. No leukocytosis. Mildly hyponatremic and hypochloremic without significant abnormalities on CMP. Urinalysis without infection. Fluid bolus, Zofran and Toradol given each significantly improved patient's symptoms. CT scan of the abdomen shows possible mild colitis. CT of the pelvis with a not well visualized (cord and recommends clinical correlation to left testicle. Testicle is present in the scrotal sac. Patient denies testicular complaints. Will discharge home with ciprofloxacin, Flagyl and symptomatic care. Encouraged patient to follow up with PCP for a blood pressure recheck and to ensure improving symptoms. Return precautions given in discharge paperwork and discussed with pt at bedside. Pt stable for discharge     Alveta HeimlichStevi Sawsan Riggio, PA-C 08/09/15 0434  Laurence Spatesachel Morgan Little, MD 08/10/15 [phone]

## 2015-08-09 LAB — URINALYSIS, ROUTINE W REFLEX MICROSCOPIC
Bilirubin Urine: NEGATIVE
GLUCOSE, UA: NEGATIVE mg/dL
Hgb urine dipstick: NEGATIVE
KETONES UR: NEGATIVE mg/dL
LEUKOCYTES UA: NEGATIVE
NITRITE: NEGATIVE
PH: 7 (ref 5.0–8.0)
Protein, ur: NEGATIVE mg/dL
SPECIFIC GRAVITY, URINE: 1.02 (ref 1.005–1.030)

## 2015-08-09 MED ORDER — METRONIDAZOLE 500 MG PO TABS: 500.0000 mg | ORAL_TABLET | Freq: Two times a day (BID) | ORAL | Status: DC

## 2015-08-09 MED ORDER — CIPROFLOXACIN HCL 500 MG PO TABS: 500.0000 mg | ORAL_TABLET | Freq: Two times a day (BID) | ORAL | Status: DC

## 2015-08-09 MED ORDER — ONDANSETRON HCL 4 MG PO TABS: 4.0000 mg | ORAL_TABLET | Freq: Four times a day (QID) | ORAL | Status: DC

## 2015-08-09 NOTE — Discharge Instructions (Signed)
You will need to follow up with a primary care doctor to get your blood pressure rechecked. It was high today.   Colitis Colitis is inflammation of the colon. Colitis may last a short time (acute) or it may last a long time (chronic). CAUSES This condition may be caused by:  Viruses.  Bacteria.  Reactions to medicine.  Certain autoimmune diseases, such as Crohn disease or ulcerative colitis. SYMPTOMS Symptoms of this condition include:  Diarrhea.  Passing bloody or tarry stool.  Pain.  Fever.  Vomiting.  Tiredness (fatigue).  Weight loss.  Bloating.  Sudden increase in abdominal pain.  Having fewer bowel movements than usual. DIAGNOSIS This condition is diagnosed with a stool test or a blood test. You may also have other tests, including X-rays, a CT scan, or a colonoscopy. TREATMENT Treatment may include:  Resting the bowel. This involves not eating or drinking for a period of time.  Fluids that are given through an IV tube.  Medicine for pain and diarrhea.  Antibiotic medicines.  Cortisone medicines.  Surgery. HOME CARE INSTRUCTIONS Eating and Drinking  Follow instructions from your health care provider about eating or drinking restrictions.  Drink enough fluid to keep your urine clear or pale yellow.  Work with a dietitian to determine which foods cause your condition to flare up.  Avoid foods that cause flare-ups.  Eat a well-balanced diet. Medicines  Take over-the-counter and prescription medicines only as told by your health care provider.  If you were prescribed an antibiotic medicine, take it as told by your health care provider. Do not stop taking the antibiotic even if you start to feel better. General Instructions  Keep all follow-up visits as told by your health care provider. This is important. SEEK MEDICAL CARE IF:  Your symptoms do not go away.  You develop new symptoms. SEEK IMMEDIATE MEDICAL CARE IF:  You have a fever  that does not go away with treatment.  You develop chills.  You have extreme weakness, fainting, or dehydration.  You have repeated vomiting.  You develop severe pain in your abdomen.  You pass bloody or tarry stool.   This information is not intended to replace advice given to you by your health care provider. Make sure you discuss any questions you have with your health care provider.   Document Released: 04/05/2004 Document Revised: 11/17/2014 Document Reviewed: 06/21/2014 Elsevier Interactive Patient Education [employer]2016 Elsevier Inc.

## 2016-01-20 ENCOUNTER — Encounter (HOSPITAL_COMMUNITY): Payer: Self-pay | Admitting: Emergency Medicine

## 2016-01-20 ENCOUNTER — Emergency Department (HOSPITAL_COMMUNITY): Payer: Medicare Other

## 2016-01-20 ENCOUNTER — Emergency Department (HOSPITAL_COMMUNITY)
Admission: EM | Admit: 2016-01-20 | Discharge: 2016-01-20 | Disposition: A | Discharge status: Home / Self Care | Payer: Medicare Other | Attending: Emergency Medicine | Admitting: Emergency Medicine

## 2016-01-20 DIAGNOSIS — Z79899 Other long term (current) drug therapy: Secondary | ICD-10-CM | POA: Diagnosis not present

## 2016-01-20 DIAGNOSIS — R519 Headache, unspecified: Secondary | ICD-10-CM

## 2016-01-20 DIAGNOSIS — R51 Headache: Secondary | ICD-10-CM | POA: Insufficient documentation

## 2016-01-20 DIAGNOSIS — R079 Chest pain, unspecified: Secondary | ICD-10-CM | POA: Diagnosis not present

## 2016-01-20 DIAGNOSIS — F419 Anxiety disorder, unspecified: Secondary | ICD-10-CM

## 2016-01-20 DIAGNOSIS — F17210 Nicotine dependence, cigarettes, uncomplicated: Secondary | ICD-10-CM | POA: Insufficient documentation

## 2016-01-20 LAB — BASIC METABOLIC PANEL
ANION GAP: 5 (ref 5–15)
BUN: 8 mg/dL (ref 6–20)
CHLORIDE: 105 mmol/L (ref 101–111)
CO2: 30 mmol/L (ref 22–32)
Calcium: 8.9 mg/dL (ref 8.9–10.3)
Creatinine, Ser: 0.7 mg/dL (ref 0.61–1.24)
GFR calc Af Amer: >60 mL/min (ref >60)
GLUCOSE: 89 mg/dL (ref 65–99)
POTASSIUM: 4.2 mmol/L (ref 3.5–5.1)
Sodium: 140 mmol/L (ref 135–145)

## 2016-01-20 LAB — I-STAT TROPONIN, ED: Troponin i, poc: 0 ng/mL (ref 0.00–0.08)

## 2016-01-20 LAB — CBC
HEMATOCRIT: 46.3 % (ref 39.0–52.0)
HEMOGLOBIN: 16.2 g/dL (ref 13.0–17.0)
MCH: 35.8 pg — AB (ref 26.0–34.0)
MCHC: 35 g/dL (ref 30.0–36.0)
MCV: 102.4 fL — AB (ref 78.0–100.0)
Platelets: 295 10*3/uL (ref 150–400)
RBC: 4.52 MIL/uL (ref 4.22–5.81)
RDW: 15 % (ref 11.5–15.5)
WBC: 6.1 10*3/uL (ref 4.0–10.5)

## 2016-01-20 MED ORDER — HYDROXYZINE HCL 25 MG PO TABS: 25.0000 mg | ORAL_TABLET | Freq: Four times a day (QID) | ORAL | 0 refills | Status: AC

## 2016-01-20 MED ORDER — ACETAMINOPHEN 325 MG PO TABS
650.0000 mg | ORAL_TABLET | Freq: Once | ORAL | Status: AC
  Administered 2016-01-20: 650 mg via ORAL
  Filled 2016-01-20: qty 2

## 2016-01-20 MED ORDER — LORAZEPAM 0.5 MG PO TABS
0.5000 mg | ORAL_TABLET | Freq: Once | ORAL | Status: DC
  Filled 2016-01-20: qty 1

## 2016-01-20 MED ORDER — NAPROXEN 500 MG PO TABS: 500.0000 mg | ORAL_TABLET | Freq: Two times a day (BID) | ORAL | 0 refills | Status: AC

## 2016-01-20 NOTE — Discharge Instructions (Signed)
Take Naprosyn as needed for headache. Take atarax as needed for anxiety. Follow up with your primary care provider for re-evaluation. Encourage smoking cessation. Return to the ED if you experience severe worsening of your symptoms, fever, increased chest pain, difficulty breathing, blurry vision, vomiting, numbness in an extremity.

## 2016-01-20 NOTE — ED Triage Notes (Addendum)
Pt c/o chest pressure, headache, shakiness, SOB, stress onset yesterday, cough x 3 days, been depressed for past 2 weeks after breaking up with girlfriend. No SI/HI. Rhonchi, expiratory wheezing concentrated in bilateral lower lobes. Pt deaf, speaks sign language.

## 2016-01-20 NOTE — ED Provider Notes (Signed)
[facility]-EMERGENCY DEPT Provider Note   CSN: [id number] Arrival date & time: 01/20/16  1342     History   Chief Complaint Chief Complaint  Patient presents with  . Chest Pain  . Anxiety    HPI Richard York is a 40 y.o. male with a past medical history of SVT, deafness who presents to the ED today complaining of anxiety. Patient states that for the last 3 weeks he has been feeling shaky and nervous and has had difficulty sleeping. Patient states his girlfriend workup within 3 weeks ago and since then he is having a lot more anxiety and depressioN. Pt also reports associated headache unrelieved by home tylenol. He does not have a history of anxiety.he denies SI, HI. Patient also states he is currently getting over a cold. 1 Week ago he was having cough, runny nose. He took off medication which seemed to improve his symptoms. Patient is a smoker and states he does have a chronic cough. He denies any fevers, chills, difficulty breathing, neck pain, blurry vision, paresthesias, vomiting. Patient does report intermittent left-sided rib pain which he states he has experienced before due to rib removal surgery many years ago. He denies any active chest pain or pressure at this time. Pt does have a PCP which he has not seen yet.  The history is provided by the patient. The history is limited by a language barrier. A language interpreter was used (American sign language interpreter).    Past Medical History:  Diagnosis Date  . [place] measles   . Malabsorption   . Scoliosis   . SVT (supraventricular tachycardia) [facility])     Patient Active Problem List   Diagnosis Date Noted  . history of SVT 08/09/2012  . Acute respiratory failure (HCC) 08/09/2012  . Community acquired pneumonia 08/09/2012  . Scoliosis 08/09/2012  . Nicotine dependence 08/09/2012  . Severe protein-calorie malnutrition (HCC) 08/09/2012    Past Surgical History:  Procedure Laterality Date  . BACK SURGERY     titanium  rods for scoliosis  . GASTROSTOMY W/ FEEDING TUBE    . TRACHEOSTOMY         Home Medications    Prior to Admission medications   Medication Sig Start Date End Date Taking? Authorizing Provider  guaifenesin (ROBITUSSIN) 100 MG/5ML syrup Take 200 mg by mouth 3 (three) times daily as needed for cough.   Yes Historical Provider, MD  ciprofloxacin (CIPRO) 500 MG tablet Take 1 tablet (500 mg total) by mouth 2 (two) times daily. Patient not taking: Reported on 01/20/2016 08/09/15   Rolm Gala Barrett, PA-C  cyclobenzaprine (FLEXERIL) 10 MG tablet Take 0.5 tablets (5 mg total) by mouth 3 (three) times daily as needed for muscle spasms. Patient not taking: Reported on 01/20/2016 07/30/14   Azalia Bilis, MD  methocarbamol (ROBAXIN) 500 MG tablet Take 1 tablet (500 mg total) by mouth 4 (four) times daily. Patient not taking: Reported on 11/18/2014 08/17/14   Rolland Porter, MD  metroNIDAZOLE (FLAGYL) 500 MG tablet Take 1 tablet (500 mg total) by mouth 2 (two) times daily. Patient not taking: Reported on 01/20/2016 08/09/15   Rolm Gala Barrett, PA-C  naproxen (NAPROSYN) 375 MG tablet Take one tablet with food as needed for back pain. You may take one tablet every 12 hours. Patient not taking: Reported on 01/20/2016 11/18/14   Arthor Captain, PA-C  ondansetron (ZOFRAN) 4 MG tablet Take 1 tablet (4 mg total) by mouth every 6 (six) hours. Patient not taking: Reported on 01/20/2016 08/09/15  Rolm GalaStevi Barrett, PA-C    Family History History reviewed. No pertinent family history.  Social History Social History  Substance Use Topics  . Smoking status: Current Every Day Smoker    Packs/day: 0.30    Types: Cigarettes  . Smokeless tobacco: Not on file  . Alcohol use Yes     Comment: occassional     Allergies   Atropine and Albuterol   Review of Systems Review of Systems  All other systems reviewed and are negative.    Physical Exam Updated Vital Signs BP 123/79 (BP Location: Right Arm)   Pulse 85   Temp 98  F (36.7 C) (Oral)   Resp 18   SpO2 100%   Physical Exam  Constitutional: He is oriented to person, place, and time. He appears well-developed and well-nourished. No distress.  HENT:  Head: Normocephalic and atraumatic.  Mouth/Throat: No oropharyngeal exudate.  Eyes: Conjunctivae and EOM are normal. Pupils are equal, round, and reactive to light. Right eye exhibits no discharge. Left eye exhibits no discharge. No scleral icterus.  Cardiovascular: Normal rate, regular rhythm, normal heart sounds and intact distal pulses.  Exam reveals no gallop and no friction rub.   No murmur heard. Pulmonary/Chest: Effort normal and breath sounds normal. No respiratory distress. He has no wheezes. He has no rales. He exhibits no tenderness.  Abdominal: Soft. He exhibits no distension. There is no tenderness. There is no guarding.  Musculoskeletal: Normal range of motion. He exhibits no edema.  Neurological: He is alert and oriented to person, place, and time. No cranial nerve deficit.  Strength 5/5 throughout. No sensory deficits. No gait abnormality. No dysmetria.  Skin: Skin is warm and dry. No rash noted. He is not diaphoretic. No erythema. No pallor.  Psychiatric: He has a normal mood and affect. His behavior is normal.  Nursing note and vitals reviewed.    ED Treatments / Results  Labs (all labs ordered are listed, but only abnormal results are displayed) Labs Reviewed  CBC - Abnormal; Notable for the following:       Result Value   MCV 102.4 (*)    MCH 35.8 (*)    All other components within normal limits  BASIC METABOLIC PANEL  I-STAT TROPOININ, ED    EKG  EKG Interpretation None       Radiology Dg Chest 2 View  Result Date: 01/20/2016 CLINICAL DATA:  Anxiety, chest pain and shortness of breath for 3 days. EXAM: CHEST  2 VIEW COMPARISON:  PA and lateral chest 11/18/2014 FINDINGS: Lungs are clear. Heart size is normal. No pneumothorax pleural effusion. Thoracolumbar fusion  hardware for scoliosis is unchanged. IMPRESSION: No acute disease. Electronically Signed   By: Drusilla Kannerhomas  Dalessio M.D.   On: 01/20/2016 15:03    Procedures Procedures (including critical care time)  Medications Ordered in ED Medications  LORazepam (ATIVAN) tablet 0.5 mg (not administered)  acetaminophen (TYLENOL) tablet 650 mg (not administered)     Initial Impression / Assessment and Plan / ED Course  I have reviewed the triage vital signs and the nursing notes.  Pertinent labs & imaging results that were available during my care of the patient were reviewed by me and considered in my medical decision making (see chart for details).  Clinical Course     40 year old male presents to the ED today complaining of "increased nervousness and shakiness" over the last 3 weeks after his girlfriend broke up with him. Patient does have intermittent left-sided rib pain is in here  for many years due to surgery after scoliosis. No neurological deficits on exam. All lab work within normal limits. Chest x-ray unremarkable. EKG and troponin also normal. Patient is low risk heart score. Low suspicion ACS. Doubt PE. Symptoms likely related to anxiety. He was given Ativan in the ED use symptomatic relief. He was also given Tylenol for ongoing headache. Patient states that he has been crying a lot every day which is causing his head to hurt a little DC with hydroxyzine. Patient has PCP and he may follow-up with him for further evaluation. Return precautions outlined in patient discharge instructions. Patient denies SI or HI.Marland Kitchen.   F .inal Clinical Impressions(s) / ED Diagnoses   Final diagnoses:  Anxiety  Nonintractable headache, unspecified chronicity pattern, unspecified headache type    New Prescriptions Discharge Medication List as of 01/20/2016  4:38 PM    START taking these medications   Details  hydrOXYzine (ATARAX/VISTARIL) 25 MG tablet Take 1 tablet (25 mg total) by mouth every 6 (six) hours.,  Starting Fri 01/20/2016, Print    !! naproxen (NAPROSYN) 500 MG tablet Take 1 tablet (500 mg total) by mouth 2 (two) times daily., Starting Fri 01/20/2016, Print     !! - Potential duplicate medications found. Please discuss with provider.       Lester KinsmanSamantha Tripp PaxicoDowless, PA-C 01/21/16 1940    Linwood DibblesJon Knapp, MD 01/23/16 [phone]

## 2016-03-08 ENCOUNTER — Encounter (HOSPITAL_COMMUNITY): Payer: Self-pay | Admitting: Emergency Medicine

## 2016-03-08 ENCOUNTER — Emergency Department (HOSPITAL_COMMUNITY)
Admission: EM | Admit: 2016-03-08 | Discharge: 2016-03-08 | Disposition: A | Discharge status: Home / Self Care | Payer: Medicare Other | Attending: Emergency Medicine | Admitting: Emergency Medicine

## 2016-03-08 DIAGNOSIS — F17210 Nicotine dependence, cigarettes, uncomplicated: Secondary | ICD-10-CM | POA: Insufficient documentation

## 2016-03-08 DIAGNOSIS — H109 Unspecified conjunctivitis: Secondary | ICD-10-CM | POA: Diagnosis not present

## 2016-03-08 DIAGNOSIS — H5711 Ocular pain, right eye: Secondary | ICD-10-CM | POA: Diagnosis present

## 2016-03-08 HISTORY — DX: Unspecified hearing loss, unspecified ear: H91.90

## 2016-03-08 MED ORDER — ERYTHROMYCIN 5 MG/GM OP OINT: TOPICAL_OINTMENT | OPHTHALMIC | 0 refills | Status: AC

## 2016-03-08 MED ORDER — FLUORESCEIN SODIUM 0.6 MG OP STRP
1.0000 | ORAL_STRIP | Freq: Once | OPHTHALMIC | Status: AC
  Administered 2016-03-08: 1 via OPHTHALMIC
  Filled 2016-03-08: qty 1

## 2016-03-08 MED ORDER — TETRACAINE HCL 0.5 % OP SOLN
1.0000 [drp] | Freq: Once | OPHTHALMIC | Status: AC
  Administered 2016-03-08: 2 [drp] via OPHTHALMIC
  Filled 2016-03-08: qty 4

## 2016-03-08 NOTE — ED Notes (Signed)
Bed: WTR6 Expected date:  Expected time:  Means of arrival:  Comments: 

## 2016-03-08 NOTE — ED Triage Notes (Signed)
Per interpretor, patient reports right eye pain x2 days. Patient reports "burning and itching" with drainage from eye. Denies injury and visual changes.

## 2016-03-08 NOTE — ED Provider Notes (Signed)
[facility]-EMERGENCY DEPT Provider Note   CSN: [id number] Arrival date & time: 03/08/16  1156  By signing my name below, I, Richard York, attest that this documentation has been prepared under the direction and in the presence of YahooKelly Karyme Mcconathy PA-C.  Electronically Signed: Vista Minkobert York, ED Scribe. 03/08/16. 3:54 PM.   History   Chief Complaint Chief Complaint  Patient presents with  . Eye Pain    HPI HPI Comments: Richard York is a 40 y.o. male who presents to the Emergency Department complaining of persistent right eye pain with associated drainage that started two days ago. History was given through ASL interpreter. Pt describes this pain as "burning" and also states that it itches. He also notes that the eye is red and has noticed drainage from the eye. Pt states, "it feels like a rash". Pt is unsure what could have happened. He states "it feels like something is in there but I can't see anything when I look in it." He wears eyeglasses but no contacts. He denies any injury or feeling anything fly into his eye. He has not tried OTC eye drops because he was afraid to do it by himself. Pt reports exacerbating pain to the eye when touching around his eye lid and below his eye. He denies any significant changes in vision. No complaints of right eye. No known sick contacts. No headache, fever, cough or sore throat.    The history is provided by the patient. A language interpreter was used.    Past Medical History:  Diagnosis Date  . Deaf   . [place]   . Malabsorption   . Scoliosis   . SVT (supraventricular tachycardia) [facility](HCC)     Patient Active Problem List   Diagnosis Date Noted  . history of SVT 08/09/2012  . Acute respiratory failure (HCC) 08/09/2012  . Community acquired pneumonia 08/09/2012  . Scoliosis 08/09/2012  . Nicotine dependence 08/09/2012  . Severe protein-calorie malnutrition (HCC) 08/09/2012    Past Surgical History:  Procedure Laterality Date  . BACK  SURGERY     titanium rods for scoliosis  . GASTROSTOMY W/ FEEDING TUBE    . TRACHEOSTOMY       Home Medications    Prior to Admission medications   Medication Sig Start Date End Date Taking? Authorizing Provider  ciprofloxacin (CIPRO) 500 MG tablet Take 1 tablet (500 mg total) by mouth 2 (two) times daily. Patient not taking: Reported on 01/20/2016 08/09/15   Rolm GalaStevi Barrett, PA-C  cyclobenzaprine (FLEXERIL) 10 MG tablet Take 0.5 tablets (5 mg total) by mouth 3 (three) times daily as needed for muscle spasms. Patient not taking: Reported on 01/20/2016 07/30/14   Azalia BilisKevin Campos, MD  guaifenesin (ROBITUSSIN) 100 MG/5ML syrup Take 200 mg by mouth 3 (three) times daily as needed for cough.    Historical Provider, MD  hydrOXYzine (ATARAX/VISTARIL) 25 MG tablet Take 1 tablet (25 mg total) by mouth every 6 (six) hours. 01/20/16   Samantha Tripp Dowless, PA-C  methocarbamol (ROBAXIN) 500 MG tablet Take 1 tablet (500 mg total) by mouth 4 (four) times daily. Patient not taking: Reported on 11/18/2014 08/17/14   Rolland PorterMark James, MD  metroNIDAZOLE (FLAGYL) 500 MG tablet Take 1 tablet (500 mg total) by mouth 2 (two) times daily. Patient not taking: Reported on 01/20/2016 08/09/15   Rolm GalaStevi Barrett, PA-C  naproxen (NAPROSYN) 375 MG tablet Take one tablet with food as needed for back pain. You may take one tablet every 12 hours. Patient not taking: Reported  on 01/20/2016 11/18/14   Arthor CaptainAbigail Harris, PA-C  naproxen (NAPROSYN) 500 MG tablet Take 1 tablet (500 mg total) by mouth 2 (two) times daily. 01/20/16   Samantha Tripp Dowless, PA-C  ondansetron (ZOFRAN) 4 MG tablet Take 1 tablet (4 mg total) by mouth every 6 (six) hours. Patient not taking: Reported on 01/20/2016 08/09/15   Alveta HeimlichStevi Barrett, PA-C    Family History No family history on file.  Social History Social History  Substance Use Topics  . Smoking status: Current Every Day Smoker    Packs/day: 0.30    Types: Cigarettes  . Smokeless tobacco: Never Used  .  Alcohol use Yes     Comment: occassional     Allergies   Atropine and Albuterol   Review of Systems Review of Systems  Constitutional: Negative for fever.  HENT: Negative for sore throat.   Eyes: Positive for pain, discharge, redness and itching. Negative for visual disturbance.  Respiratory: Negative for cough.   Neurological: Negative for headaches.     Physical Exam Updated Vital Signs BP 137/97   Pulse 76   Temp 98.4 F (36.9 C)   Resp 16   SpO2 99%   Physical Exam  Constitutional: He is oriented to person, place, and time. He appears well-developed and well-nourished. No distress.  Thin  HENT:  Head: Normocephalic and atraumatic.  Mouth/Throat: Mucous membranes are dry.  Eyes: EOM and lids are normal. Pupils are equal, round, and reactive to light. Lids are everted and swept, no foreign bodies found. Right eye exhibits discharge. Right eye exhibits no chemosis, no exudate and no hordeolum. No foreign body present in the right eye. Left eye exhibits no chemosis, no discharge, no exudate and no hordeolum. No foreign body present in the left eye. Right conjunctiva is injected. Right conjunctiva has no hemorrhage. Left conjunctiva is not injected. Left conjunctiva has no hemorrhage. No scleral icterus.  Fundoscopic exam:      The right eye shows no AV nicking and no papilledema.       The left eye shows no AV nicking and no papilledema.  Slit lamp exam:      The right eye shows no corneal abrasion and no fluorescein uptake.  Neck: Normal range of motion.  Cardiovascular: Normal rate.   Pulmonary/Chest: Effort normal. No respiratory distress.  Abdominal: He exhibits no distension.  Neurological: He is alert and oriented to person, place, and time.  Skin: Skin is warm and dry.  Psychiatric: His behavior is normal. His mood appears anxious (mildly).  Nursing note and vitals reviewed.    ED Treatments / Results  DIAGNOSTIC STUDIES: Oxygen Saturation is 99% on RA,  normal by my interpretation.  COORDINATION OF CARE: 3:28 PM-Discussed treatment plan with pt at bedside and pt agreed to plan.   Labs (all labs ordered are listed, but only abnormal results are displayed) Labs Reviewed - No data to display  EKG  EKG Interpretation None       Radiology No results found.  Procedures Procedures (including critical care time)  Medications Ordered in ED Medications  tetracaine (PONTOCAINE) 0.5 % ophthalmic solution 1-2 drop (2 drops Right Eye Given 03/08/16 1550)  fluorescein ophthalmic strip 1 strip (1 strip Right Eye Given 03/08/16 1554)     Initial Impression / Assessment and Plan / ED Course  I have reviewed the triage vital signs and the nursing notes.  Pertinent labs & imaging results that were available during my care of the patient were reviewed by  me and considered in my medical decision making (see chart for details).  Clinical Course    40 year old male with conjunctivitis. Likely viral. No uptake with fluorescein. He does not wear contacts. Will advise erythromycin ointment and contact ophthalmologist for close follow-up. Return precautions given.   Final Clinical Impressions(s) / ED Diagnoses   Final diagnoses:  Conjunctivitis of right eye, unspecified conjunctivitis type    New Prescriptions Discharge Medication List as of 03/08/2016  4:37 PM    START taking these medications   Details  erythromycin ophthalmic ointment Place a 1/2 inch ribbon of ointment into the lower eyelid., Print       I personally performed the services described in this documentation, which was scribed in my presence. The recorded information has been reviewed and is accurate.     Bethel Born, PA-C 03/09/16 1103    Shaune Pollack, MD 03/09/16 1330

## 2016-03-09 ENCOUNTER — Encounter (HOSPITAL_COMMUNITY): Payer: Self-pay | Admitting: Medical

## 2017-05-07 IMAGING — CT CT ABD-PELV W/ CM
2 of 4 series · 16 of 46 positions shown, 18 images · IV contrast (ISOVUE)
Comparison: None.

CLINICAL DATA: Abdominal pain for 1 week.  Diarrhea.

EXAM:
CT ABDOMEN AND PELVIS WITH CONTRAST
TECHNIQUE: Multidetector CT imaging of the abdomen and pelvis was performed
using the standard protocol following bolus administration of
intravenous contrast.
CONTRAST:  75mL VJNYT8-500 IOPAMIDOL (VJNYT8-500) INJECTION 61%

[Series 2: abd/pel with · axial · 0.65mm/px · z∈[+900,+1256]mm · 13 of 79 slices shown, 15 images]
[im 4/79  soft-tissue]
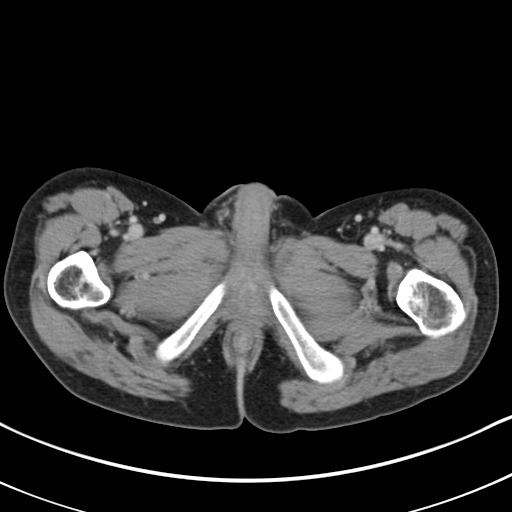
[im 4/79  bone]
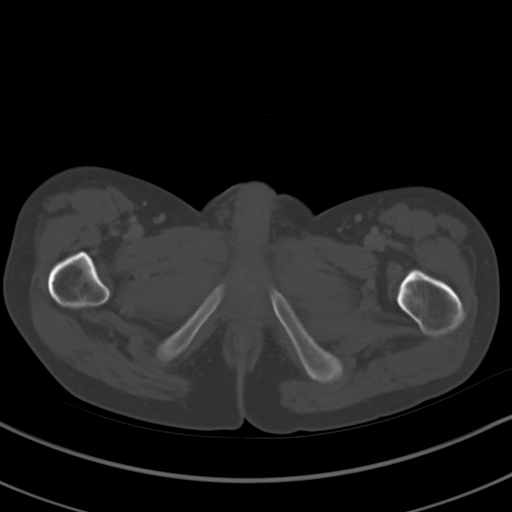
[im 11/79  soft-tissue]
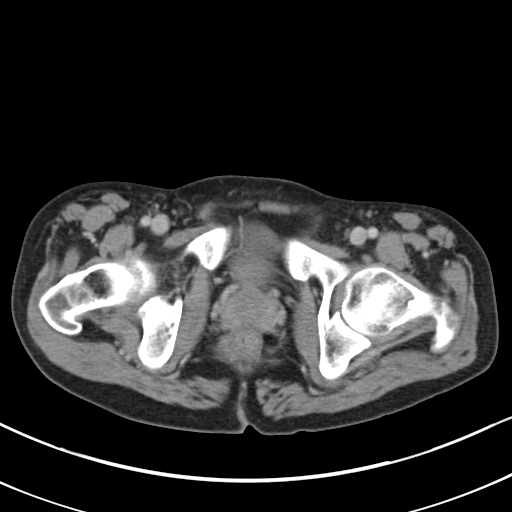
[im 18/79  soft-tissue]
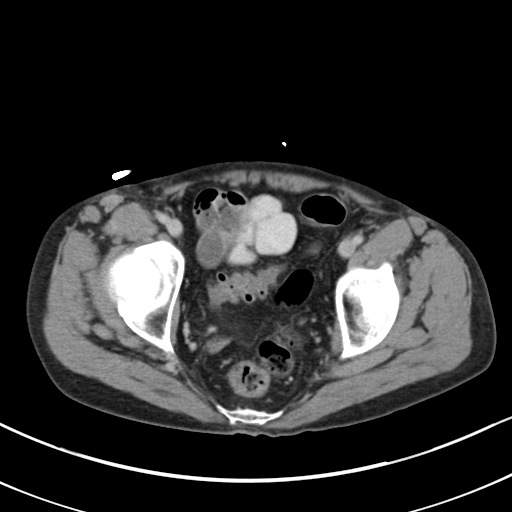
[im 22/79  soft-tissue]
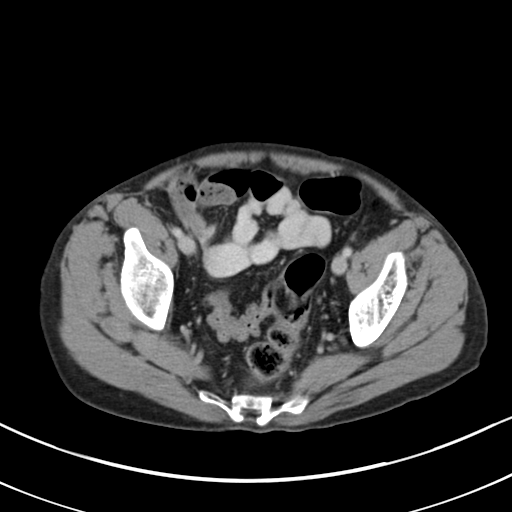
[im 29/79  soft-tissue]
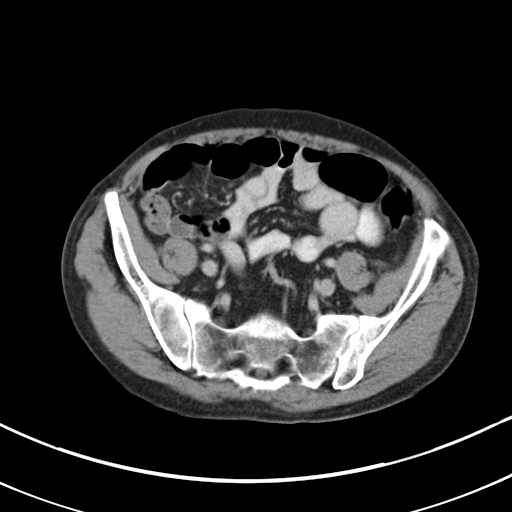
[im 32/79  soft-tissue]
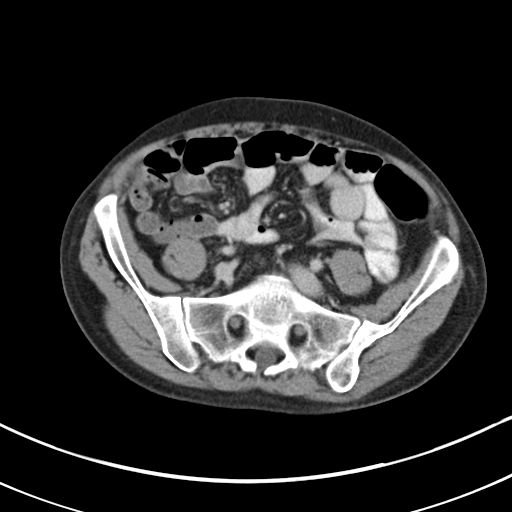
[im 40/79  soft-tissue]
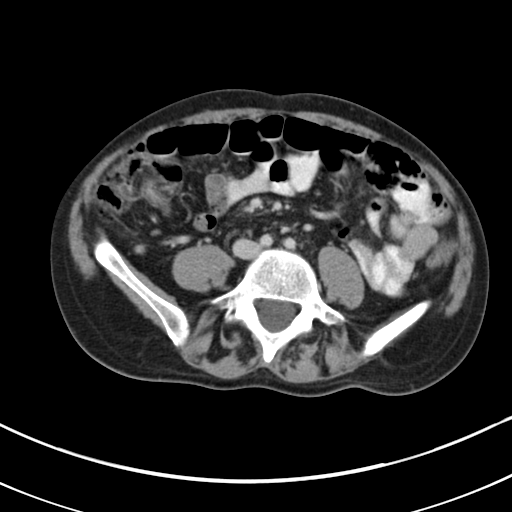
[im 47/79  soft-tissue]
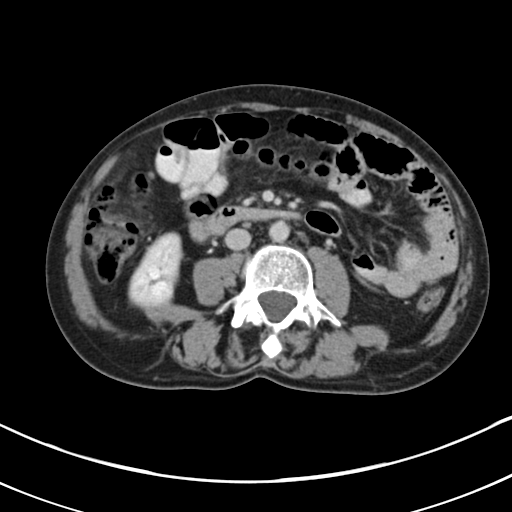
[im 50/79  soft-tissue]
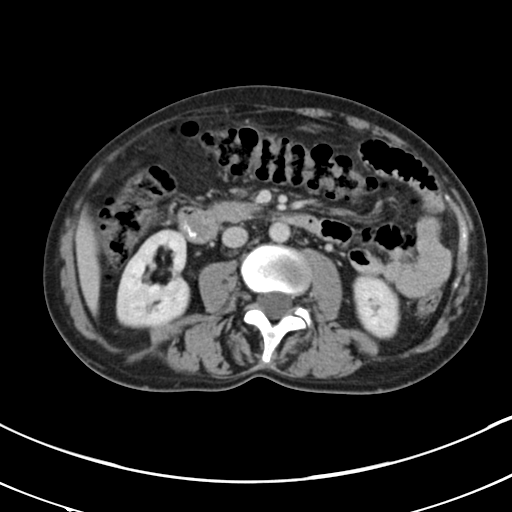
[im 50/79  bone]
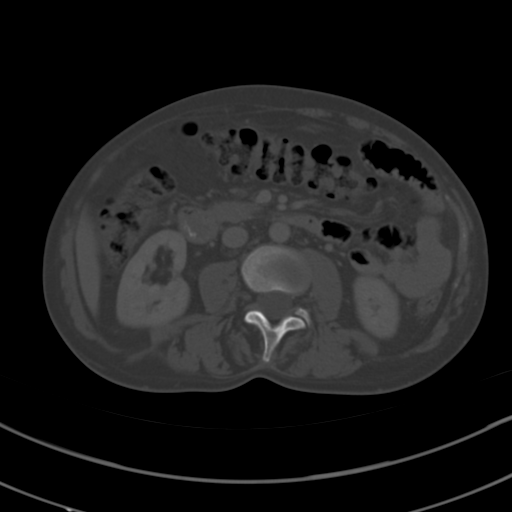
[im 57/79  soft-tissue]
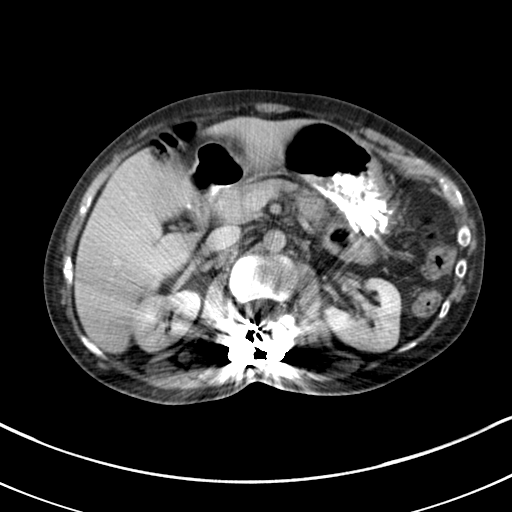
[im 61/79  soft-tissue]
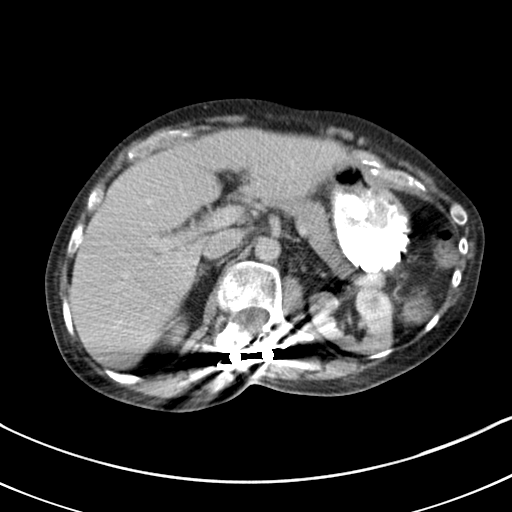
[im 68/79  soft-tissue]
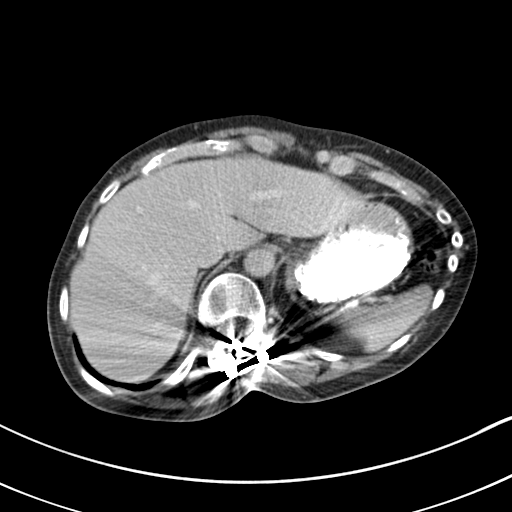
[im 75/79  soft-tissue]
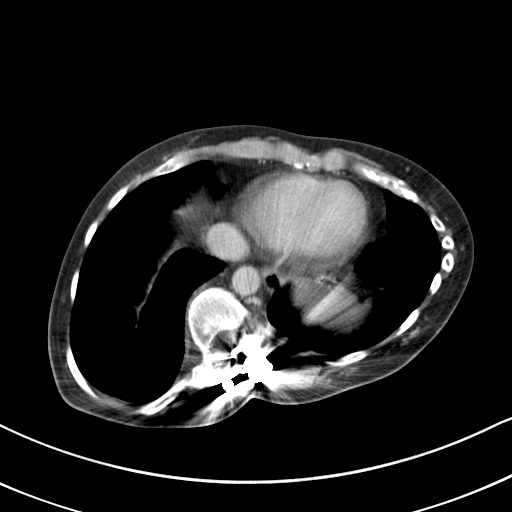

[Series 4: coronal a/|p · coronal · 0.60mm/px · 3 of 111 slices shown]
[im 37/111  soft-tissue]
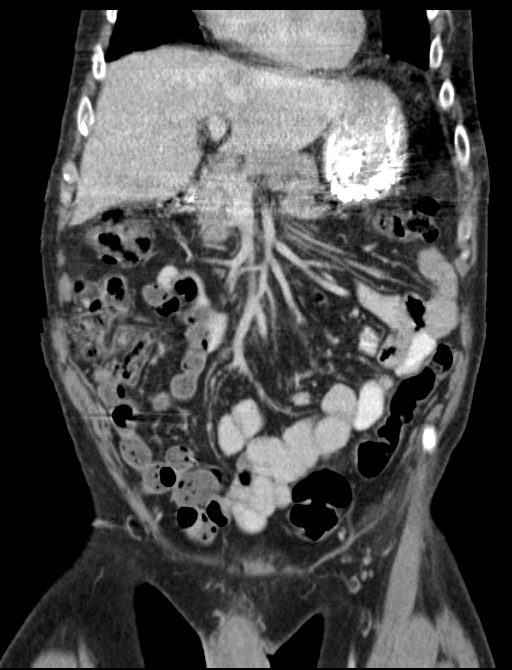
[im 49/111  soft-tissue]
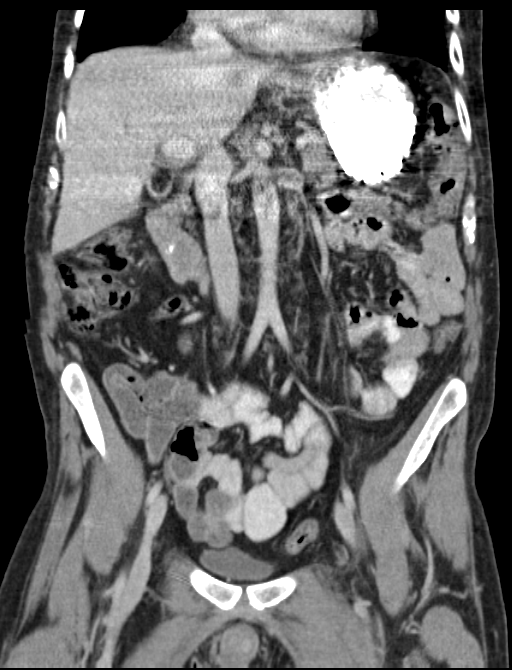
[im 62/111  soft-tissue]
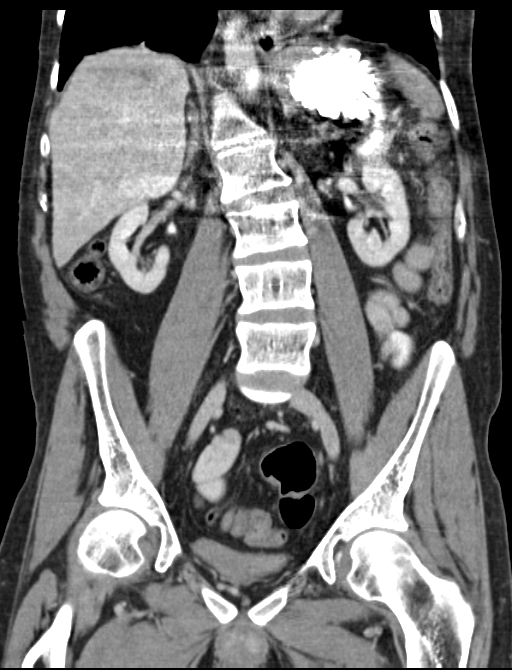

[16 of 46 positions shown; findings below may reference images not displayed]

FINDINGS: Lower chest: Distorted lower thorax anatomy due to a scoliosis. No
consolidation or pleural effusion.

Liver: No focal lesion.

Hepatobiliary: Gallbladder not definitively seen, likely completely
decompressed. Recommend correlation for cholecystectomy.

Pancreas: No ductal dilatation or inflammation.

Spleen: Normal.

Adrenal glands: No nodule.

Kidneys: Symmetric renal enhancement. No hydronephrosis. Portions of
the kidneys are obscured by streak artifact from adjacent hardware.

Stomach/Bowel: Stomach physiologically distended. There are no
dilated or thickened small bowel loops. Portions of the colon are
decompressed with probable mild wall thickening at the splenic
flexure and sigmoid colon. Small volume of stool throughout the
remainder of the colon. No definite perienteric of tissue edema. The
appendix is normal.

Vascular/Lymphatic: No retroperitoneal adenopathy. Abdominal aorta
is normal in caliber.

Reproductive: Normal sized prostate gland. Left spermatic cord not
definitively seen. No cryptorchid testis is seen.

Bladder: Minimally distended, no definite wall thickening.

Other: No free air, free fluid, or intra-abdominal fluid collection.

Musculoskeletal: Posterior fusion thoracic and upper lumbar spine.
There are no acute or suspicious osseous abnormalities.
IMPRESSION: 1. Equivocal mild colonic wall thickening involving the splenic
flexure and sigmoid colon, can be seen with mild colitis in the
setting of diarrhea.
2. No additional acute abnormality in the abdomen/pelvis.
3. Left spermatic cord is not well visualized, recommend clinical
correlation to determine if left testis is in the scrotal sac.
